# Patient Record
Sex: Female | Born: 1985 | Race: White | Hispanic: No | Marital: Married
Health system: Midwestern US, Academic
[De-identification: ages and names within clinical notes are randomized; demographics above are authoritative.]

## PROBLEM LIST (undated history)

## (undated) ENCOUNTER — Inpatient Hospital Stay (HOSPITAL_COMMUNITY): Payer: Self-pay

## (undated) DIAGNOSIS — I340 Nonrheumatic mitral (valve) insufficiency: Secondary | ICD-10-CM

## (undated) DIAGNOSIS — R1011 Right upper quadrant pain: Secondary | ICD-10-CM

## (undated) DIAGNOSIS — G971 Other reaction to spinal and lumbar puncture: Secondary | ICD-10-CM

## (undated) DIAGNOSIS — D649 Anemia, unspecified: Secondary | ICD-10-CM

## (undated) HISTORY — DX: Other reaction to spinal and lumbar puncture: G97.1

## (undated) HISTORY — PX: APPENDECTOMY: SHX54

## (undated) LAB — PREPARE FRESH FROZEN PLASMA
Blood Expiration Date: 202109040320
Blood Expiration Date: 202109040322
Dispense Status: TRANSFUSED
Dispense Status: TRANSFUSED

## (undated) LAB — PREPARE PLATELETS, LEUKOREDUCED
Blood Expiration Date: 202108312359
Dispense Status: TRANSFUSED

## (undated) LAB — PREPARE RBC, LEUKOREDUCED
Blood Expiration Date: 202109092359
Blood Expiration Date: 202109132359
Blood Expiration Date: 202109152359
Blood Expiration Date: 202109212359
Blood Expiration Date: 202109232359
Blood Expiration Date: 202109232359
Dispense Status: TRANSFUSED
Dispense Status: TRANSFUSED
Dispense Status: TRANSFUSED
Dispense Status: TRANSFUSED
Dispense Status: TRANSFUSED
Dispense Status: TRANSFUSED

---

## 2014-02-19 NOTE — Unmapped (Signed)
Patient is a 28 year old female referred to Dr. Fayette Pho by Dr Modena Jansky for evaluation of a left breast abcess. Patient was started on Keflex on 02/18/14. She is scheduled to see Dr. Fayette Pho on Wed, 02/24/14 for evaluation.

## 2014-02-24 ENCOUNTER — Ambulatory Visit: Admit: 2014-02-24 | Discharge: 2014-02-24 | Payer: PRIVATE HEALTH INSURANCE | Attending: Surgical Oncology

## 2014-02-24 DIAGNOSIS — S21009A Unspecified open wound of unspecified breast, initial encounter: Secondary | ICD-10-CM

## 2014-02-24 NOTE — Unmapped (Signed)
Pre-operative diagnosis:  Left breast open wound with hypertrophic granulation tissue  Post-operative diagnosis:  Left breast open wound with hypertrophic granulation tissue  Procedure:  Silver nitrate to hypertrophic granulation tissue  Surgeon:  Silva Bandy, MD  Anesthetic:  3 mL 1.0% lidocaine with epinephrine    After obtaining informed consent, the patient was placed in the recumbent position.  A timeout was called, and the procedure planned, site and side confirmed before we proceeded.  The left breast was prepped and draped in the usual sterile fashion.  Local anesthetic in the form of 3 mL of 1% lidocaine was used to infiltrate the dermis and subcutaneous tissue deep to the surface of the granulation tissue.  A stick of silver nitrate was rolled over the hypertrophic granulation until the tissue was flush with the skin.  This was then dressed with antibiotic ointment and a bandaid.  The patient tolerated the procedure well.  Tanya Dunn A. Fayette Pho, MD, PhD

## 2014-02-24 NOTE — Unmapped (Signed)
2 week follow up     Tonye Royalty  Administrative Assistant  Silva Bandy, MD  Main Office Ph# [phone]  Line to leave voice message: [phone]  Fax# [phone]    Ollen Gross, RN  Office phone# [phone]  After Hours phone #[phone]    Hours of Operation  Monday through Friday  8 am to 5 pm

## 2014-02-24 NOTE — Unmapped (Signed)
History of Present Illness:   Tanya Dunn is a 28 y.o. female    HPI   Patient is a 28 year old female, recently postpartum, who developed a left breast mastitis which evolved into an abscess.  Her physician,  Dr. Modena Jansky, placed her on Keflex.  The abscess decompressed on its own, and the wound has nearly healed but has hypertrophic granulation tissue extending out of the wound.  The patient reports reports no further erythema, or pain at the site.  She denies having any palpable mass or discharge.      Breast History:   Race: Caucasian  Ethnicity: non Ashkenazi [place], [place] and Amish ancestry  Age of Menarche: 81  LMP: May 2014  Gravida Para Aborta G1P1A0  Age at first childbirth: 75; Pt is 8 weeks postpartum  History of breast feeding: yes for 5 weeks  History of hormonal contraceptives: no  Age of Menopause:  n/a  History of HRT: no  Assisted Reproduction Treatments: yes, bromocriphine  Hereditary risk factors (pre-breast biopsy): yes  Prior history of breast biopsy: no  Family history of breast or ovarian cancer: Paternal aunt was diagnosed with ovarian cancer in her 66's.  Family history of other cancers: Maternal aunt was diagnosed with melanoma in her 43's.  Patient interested in future childbearing: yes   Patient interested in meeting with a fertility specialist: already has a fertility specialist    Histories:     She has no past medical history on file.    She has past surgical history that includes Appendectomy.    Her family history includes Cancer in her maternal aunt and paternal aunt; Heart disease in her maternal grandfather and paternal grandfather; Melanoma in her paternal aunt; Ovarian cancer in her maternal aunt; Stroke in her maternal grandfather and paternal grandfather.    She reports that she has never smoked. She has never used smokeless tobacco. She reports that she drinks alcohol. She reports that she does not use illicit drugs.      Review of Systems   Constitutional:  Positive for fatigue. Negative for fever, activity change and appetite change.   HENT: Negative for congestion, hearing loss, sinus pressure and sore throat.    Eyes: Negative for pain and visual disturbance.   Respiratory: Negative for cough and shortness of breath.    Cardiovascular: Negative for chest pain and palpitations.   Gastrointestinal: Negative for nausea, abdominal pain, diarrhea and constipation.   Genitourinary: Negative for dysuria, urgency, frequency and difficulty urinating.   Musculoskeletal: Positive for myalgias (bilateral breast tenderness). Negative for arthralgias.   Skin: Positive for wound (left breast has a healing area where the abscess drained on it's own). Negative for color change.   Neurological: Negative for dizziness, seizures, weakness and headaches.   Hematological: Negative for adenopathy. Does not bruise/bleed easily.   Psychiatric/Behavioral: Negative for dysphoric mood. The patient is not nervous/anxious.        Allergies:   Prochlorperazine; Promethazine; and Sulfa (sulfonamide antibiotics)    Medications:     Outpatient Encounter Prescriptions as of 02/24/2014   Medication Sig Dispense Refill   ??? dicloxacillin (DYNAPEN) 500 MG capsule        ??? ibuprofen (ADVIL,MOTRIN) 600 MG tablet Take by mouth.       ??? prenatal vit-iron fumarate-FA 27-1 mg Tab Take by mouth.         No facility-administered encounter medications on file as of 02/24/2014.        Objective:  Blood pressure 105/71, pulse 82, temperature 98.2 ??F (36.8 ??C), temperature source Oral, resp. rate 16, height 5' 3 (1.6 m), weight 152 lb (68.947 kg), SpO2 98.00%.    Physical Exam   Vitals reviewed.  Constitutional: She is oriented to person, place, and time. She appears well-developed and well-nourished. No distress.   HENT:   Head: Normocephalic and atraumatic.   Nose: Nose normal.   Mouth/Throat: No oropharyngeal exudate.   Eyes: Conjunctivae and EOM are normal. Pupils are equal, round, and reactive to light. No  scleral icterus.   Neck: Normal range of motion. Neck supple. No thyromegaly present.   Cardiovascular: Normal rate, regular rhythm and normal heart sounds.  Exam reveals no gallop and no friction rub.    No murmur heard.  Pulmonary/Chest: Effort normal and breath sounds normal. She has no wheezes. Right breast exhibits no inverted nipple, no mass, no nipple discharge, no skin change and no tenderness. Left breast exhibits skin change. Left breast exhibits no inverted nipple, no mass, no nipple discharge and no tenderness. Breasts are symmetrical.       Abdominal: Soft. She exhibits no distension and no mass. There is no tenderness.   Musculoskeletal: Normal range of motion. She exhibits no edema and no tenderness.   Lymphadenopathy:     She has no cervical adenopathy.        Right cervical: No superficial cervical and no deep cervical adenopathy present.       Left cervical: No superficial cervical and no deep cervical adenopathy present.     She has no axillary adenopathy.        Right axillary: No pectoral and no lateral adenopathy present.        Left axillary: No pectoral and no lateral adenopathy present.       Right: No supraclavicular adenopathy present.        Left: No supraclavicular adenopathy present.   Neurological: She is alert and oriented to person, place, and time. She exhibits normal muscle tone.   Skin: Skin is warm and dry. No erythema.   Psychiatric: She has a normal mood and affect. Her behavior is normal. Judgment and thought content normal.       Review of Lab Results:      No results found for this basename: WBC, HGB, HCT, PLT, GLUCOSE,  BUN, CREATININE, NA, K, CL, CO2, CASERUM, MAGNES,  ALBUMINSERUM, PREALBUMIN, GFRNONAFRAM,  EGFR, LDH, BILITOT, BILIDIR, PROT, AST, ALT, ALKPHOS, CA199, CEA, CA125, AFPTM       Imaging:       Pathology:       Cancer Staging:   No matching staging information was found for the patient.       Assessment:   1.  Patient with hypertrophic granulation tissue above  the level of skin.    Plan:   1.  This phenomenon can be managed by cautery.  Here, after obtaining consent from the patient, the area was prepped and local anesthetic used to infiltration along the skin and under the granulation tissue.  A Silver nitrate stick was rolled over the surface, until such time as the tissue was now skin level.  This was dressed with antibiotic ointment, gauze and tape.  The patient tolerated the procedure well.  Please see procedure note.  2.  Patient to return in two weeks to check the wound.

## 2014-02-25 NOTE — Unmapped (Signed)
Called patient, left message stating that we are following up with her to discuss her consultation visit with Dr. Silva Bandy on Feb 24, 2014.  Informed the patient that we simply want to make sure that she has no additional questions/concerns that we may need to further assist with and checking if everything met her expectations from checking in & registering, ease of finding office location & parking.  Also, requested patient call if she could recommend any area of improvement.  Reminded her that our staff are available should she think of anything else that we can assist her with.  Provided office contact information.

## 2014-03-10 ENCOUNTER — Ambulatory Visit: Admit: 2014-03-10 | Discharge: 2014-03-10 | Payer: PRIVATE HEALTH INSURANCE | Attending: Surgical Oncology

## 2014-03-10 DIAGNOSIS — N6011 Diffuse cystic mastopathy of right breast: Secondary | ICD-10-CM

## 2014-03-10 DIAGNOSIS — N6012 Diffuse cystic mastopathy of left breast: Secondary | ICD-10-CM

## 2014-03-10 NOTE — Unmapped (Signed)
History of Present Illness:   Tanya Dunn is a 28 y.o. female    HPI   Patient is a 28 year old female, recently postpartum, who developed a left breast mastitis which evolved into an abscess. Her physician, Dr. Modena Jansky, placed her on Keflex. The abscess decompressed on its own, and the wound has nearly healed but has hypertrophic granulation tissue extending out of the wound. At the last visit, the hypertrophic granulation tissue was treated with silver nitrate.      The patient returns today for follow-up.  The wound area has epithelialized and now healed.      Breast History:   Race: Caucasian   Ethnicity: non Ashkenazi [place], [place] and [place] ancestry   Age of Menarche: 47   LMP: May 2014   Gravida Para Aborta G1P1A0   Age at first childbirth: 89; Pt is 8 weeks postpartum   History of breast feeding: yes for 5 weeks   History of hormonal contraceptives: no   Age of Menopause: n/a   History of HRT: no   Assisted Reproduction Treatments: yes, bromocriphine   Hereditary risk factors (pre-breast biopsy): yes   Prior history of breast biopsy: no   Family history of breast or ovarian cancer: Paternal aunt was diagnosed with ovarian cancer in her 56's.   Family history of other cancers: Maternal aunt was diagnosed with melanoma in her 16's.   Patient interested in future childbearing: yes   Patient interested in meeting with a fertility specialist: already has a fertility specialist        Histories:     She has no past medical history on file.    She has past surgical history that includes Appendectomy.    Her family history includes Cancer in her maternal aunt and paternal aunt; Heart disease in her maternal grandfather and paternal grandfather; Melanoma in her paternal aunt; Ovarian cancer in her maternal aunt; Stroke in her maternal grandfather and paternal grandfather.    She reports that she has never smoked. She has never used smokeless tobacco. She reports that she drinks alcohol. She reports that  she does not use illicit drugs.      Review of Systems   Constitutional: Negative for fever, activity change, appetite change and fatigue.   HENT: Negative for congestion, ear discharge, sinus pressure and sore throat.    Eyes: Negative for pain and visual disturbance.   Respiratory: Negative for cough and shortness of breath.    Cardiovascular: Negative for chest pain and palpitations.   Gastrointestinal: Negative for nausea, abdominal pain, diarrhea and constipation.   Genitourinary: Negative for urgency, frequency and difficulty urinating.   Musculoskeletal: Negative for arthralgias and myalgias.   Skin: Negative for color change and wound.   Neurological: Negative for dizziness, weakness and headaches.   Hematological: Negative for adenopathy. Does not bruise/bleed easily.   Psychiatric/Behavioral: Negative for dysphoric mood. The patient is not nervous/anxious.        Allergies:   Prochlorperazine; Promethazine; and Sulfa (sulfonamide antibiotics)    Medications:     Outpatient Encounter Prescriptions as of 03/10/2014   Medication Sig Dispense Refill   ??? dicloxacillin (DYNAPEN) 500 MG capsule        ??? ibuprofen (ADVIL,MOTRIN) 600 MG tablet Take by mouth.       ??? prenatal vit-iron fumarate-FA 27-1 mg Tab Take by mouth.         No facility-administered encounter medications on file as of 03/10/2014.  Objective:       Blood pressure 98/66, pulse 84, temperature 98.2 ??F (36.8 ??C), temperature source Oral, resp. rate 16, height 5' 3 (1.6 m), weight 151 lb (68.493 kg), SpO2 98.00%.    Physical Exam   Vitals reviewed.  Constitutional: She is oriented to person, place, and time. She appears well-developed and well-nourished. No distress.   HENT:   Head: Normocephalic and atraumatic.   Nose: Nose normal.   Mouth/Throat: No oropharyngeal exudate.   Eyes: Conjunctivae and EOM are normal. Pupils are equal, round, and reactive to light. No scleral icterus.   Neck: Normal range of motion. Neck supple. No thyromegaly  present.   Cardiovascular: Normal rate, regular rhythm and normal heart sounds.  Exam reveals no gallop and no friction rub.    No murmur heard.  Pulmonary/Chest: Effort normal and breath sounds normal. She has no wheezes. Right breast exhibits no inverted nipple, no mass, no nipple discharge, no skin change and no tenderness. Left breast exhibits no inverted nipple, no mass, no nipple discharge, no skin change and no tenderness. Breasts are symmetrical.       Abdominal: Soft. She exhibits no distension and no mass. There is no tenderness.   Musculoskeletal: Normal range of motion. She exhibits no edema and no tenderness.   Lymphadenopathy:     She has no cervical adenopathy.        Right cervical: No superficial cervical and no deep cervical adenopathy present.       Left cervical: No superficial cervical and no deep cervical adenopathy present.     She has no axillary adenopathy.        Right axillary: No pectoral and no lateral adenopathy present.        Left axillary: No pectoral and no lateral adenopathy present.       Right: No supraclavicular adenopathy present.        Left: No supraclavicular adenopathy present.   Neurological: She is alert and oriented to person, place, and time. She exhibits normal muscle tone.   Skin: Skin is warm and dry. No erythema.   Psychiatric: She has a normal mood and affect. Her behavior is normal. Judgment and thought content normal.       Review of Lab Results:      No results found for this basename: WBC, HGB, HCT, PLT, GLUCOSE,  BUN, CREATININE, NA, K, CL, CO2, CASERUM, MAGNES,  ALBUMINSERUM, PREALBUMIN, GFRNONAFRAM,  EGFR, LDH, BILITOT, BILIDIR, PROT, AST, ALT, ALKPHOS, CA199, CEA, CA125, AFPTM       Imaging:       Pathology:       Cancer Staging:   No matching staging information was found for the patient.       Assessment:   1.  Patient with a healed left breast wound, with resolved hypertropic granulation tissue.    Plan:   1.  Patient to return if any further problem  with the breast arises.

## 2014-03-10 NOTE — Unmapped (Signed)
Return to office as needed.  Please call with questions or concerns.      Tonye Royalty  Administrative Assistant  Silva Bandy, MD  Main Office Ph# [phone]  Line to leave voice message: [phone]  Fax# [phone]    Ollen Gross, RN  Office phone# [phone]  After Hours phone #[phone]    Hours of Operation  Monday through Friday  8 am to 5 pm

## 2016-05-10 ENCOUNTER — Other Ambulatory Visit (HOSPITAL_COMMUNITY)
Admission: RE | Admit: 2016-05-10 | Discharge: 2016-05-10 | Disposition: A | Discharge status: Home / Self Care | Payer: 59 | Source: Ambulatory Visit | Attending: Obstetrics and Gynecology | Admitting: Obstetrics and Gynecology

## 2016-05-10 ENCOUNTER — Other Ambulatory Visit: Payer: Self-pay | Admitting: Nurse Practitioner

## 2016-05-10 DIAGNOSIS — Z01419 Encounter for gynecological examination (general) (routine) without abnormal findings: Secondary | ICD-10-CM | POA: Diagnosis present

## 2016-05-15 LAB — CYTOLOGY - PAP

## 2016-05-17 ENCOUNTER — Other Ambulatory Visit: Payer: Self-pay | Admitting: Nurse Practitioner

## 2016-05-17 DIAGNOSIS — E229 Hyperfunction of pituitary gland, unspecified: Principal | ICD-10-CM

## 2016-05-17 DIAGNOSIS — R7989 Other specified abnormal findings of blood chemistry: Secondary | ICD-10-CM

## 2016-05-17 DIAGNOSIS — N643 Galactorrhea not associated with childbirth: Secondary | ICD-10-CM

## 2016-05-31 ENCOUNTER — Other Ambulatory Visit: Payer: 59

## 2016-06-12 ENCOUNTER — Ambulatory Visit
Admission: RE | Admit: 2016-06-12 | Discharge: 2016-06-12 | Disposition: A | Discharge status: Home / Self Care | Payer: 59 | Source: Ambulatory Visit | Attending: Nurse Practitioner | Admitting: Nurse Practitioner

## 2016-06-12 DIAGNOSIS — N643 Galactorrhea not associated with childbirth: Secondary | ICD-10-CM

## 2016-06-12 DIAGNOSIS — E229 Hyperfunction of pituitary gland, unspecified: Principal | ICD-10-CM

## 2016-06-12 DIAGNOSIS — R7989 Other specified abnormal findings of blood chemistry: Secondary | ICD-10-CM

## 2016-06-12 MED ORDER — GADOBENATE DIMEGLUMINE 529 MG/ML IV SOLN
10.0000 mL | Freq: Once | INTRAVENOUS | Status: AC | PRN
  Administered 2016-06-12: 10 mL via INTRAVENOUS

## 2016-07-11 ENCOUNTER — Other Ambulatory Visit: Payer: Self-pay | Admitting: Internal Medicine

## 2016-07-11 DIAGNOSIS — I340 Nonrheumatic mitral (valve) insufficiency: Secondary | ICD-10-CM

## 2016-07-23 ENCOUNTER — Other Ambulatory Visit (HOSPITAL_COMMUNITY): Payer: 59

## 2016-07-30 ENCOUNTER — Other Ambulatory Visit (HOSPITAL_COMMUNITY): Payer: 59

## 2016-08-03 ENCOUNTER — Telehealth: Payer: Self-pay | Admitting: *Deleted

## 2016-08-03 NOTE — Telephone Encounter (Signed)
A message was left Samantha Mahoney to call us to reschedule her echo. Patient cancel on 10/23 and no-showed on 07/30/16.

## 2016-10-01 NOTE — L&D Delivery Note (Signed)
Delivery Note At 4:08 PM a viable female was delivered via  (Presentation: ROA).  APGAR: 8, 9; weight pending .  Loose nuchal cord around leg. Placenta status: to L&D Cord: normal  with the following complications: none.    Anesthesia:  epidural Episiotomy:  none Lacerations:  2nd degree perineal Suture Repair: 2.0 3.0 vicryl Est. Blood Loss (mL):  300cc  Mom to postpartum.  Baby to Couplet care / Skin to Skin.  [name], M 07/13/2017, 4:32 PM

## 2016-11-29 LAB — OB RESULTS CONSOLE RUBELLA ANTIBODY, IGM: Rubella: IMMUNE

## 2016-11-29 LAB — OB RESULTS CONSOLE ABO/RH: RH Type: POSITIVE

## 2016-11-29 LAB — OB RESULTS CONSOLE HIV ANTIBODY (ROUTINE TESTING): HIV: NONREACTIVE

## 2016-11-29 LAB — OB RESULTS CONSOLE ANTIBODY SCREEN: ANTIBODY SCREEN: NEGATIVE

## 2016-11-29 LAB — OB RESULTS CONSOLE HEPATITIS B SURFACE ANTIGEN: HEP B S AG: NEGATIVE

## 2016-11-29 LAB — OB RESULTS CONSOLE RPR: RPR: NONREACTIVE

## 2016-12-19 LAB — OB RESULTS CONSOLE GC/CHLAMYDIA
CHLAMYDIA, DNA PROBE: NEGATIVE
GC PROBE AMP, GENITAL: NEGATIVE

## 2017-02-06 ENCOUNTER — Other Ambulatory Visit: Payer: Self-pay | Admitting: Internal Medicine

## 2017-02-06 ENCOUNTER — Other Ambulatory Visit: Payer: Self-pay

## 2017-02-06 ENCOUNTER — Ambulatory Visit (HOSPITAL_COMMUNITY): Payer: 59 | Attending: Cardiovascular Disease

## 2017-02-06 DIAGNOSIS — I340 Nonrheumatic mitral (valve) insufficiency: Secondary | ICD-10-CM

## 2017-02-06 DIAGNOSIS — I341 Nonrheumatic mitral (valve) prolapse: Secondary | ICD-10-CM | POA: Diagnosis present

## 2017-02-06 DIAGNOSIS — I081 Rheumatic disorders of both mitral and tricuspid valves: Secondary | ICD-10-CM | POA: Diagnosis not present

## 2017-02-21 ENCOUNTER — Ambulatory Visit: Payer: 59 | Admitting: Interventional Cardiology

## 2017-04-12 ENCOUNTER — Inpatient Hospital Stay (HOSPITAL_COMMUNITY)
Admission: AD | Admit: 2017-04-12 | Discharge: 2017-04-12 | Disposition: A | Discharge status: Home / Self Care | Payer: 59 | Source: Ambulatory Visit | Attending: Obstetrics & Gynecology | Admitting: Obstetrics & Gynecology

## 2017-04-12 ENCOUNTER — Encounter (HOSPITAL_COMMUNITY): Payer: Self-pay

## 2017-04-12 DIAGNOSIS — O26892 Other specified pregnancy related conditions, second trimester: Secondary | ICD-10-CM | POA: Diagnosis not present

## 2017-04-12 DIAGNOSIS — Z3A26 26 weeks gestation of pregnancy: Secondary | ICD-10-CM | POA: Insufficient documentation

## 2017-04-12 DIAGNOSIS — R1011 Right upper quadrant pain: Secondary | ICD-10-CM | POA: Diagnosis present

## 2017-04-12 DIAGNOSIS — O26899 Other specified pregnancy related conditions, unspecified trimester: Secondary | ICD-10-CM

## 2017-04-12 HISTORY — DX: Anemia, unspecified: D64.9

## 2017-04-12 HISTORY — DX: Right upper quadrant pain: R10.11

## 2017-04-12 HISTORY — DX: Nonrheumatic mitral (valve) insufficiency: I34.0

## 2017-04-12 LAB — COMPREHENSIVE METABOLIC PANEL
ALBUMIN: 3.1 g/dL — AB (ref 3.5–5.0)
ALT: 11 U/L — AB (ref 14–54)
AST: 16 U/L (ref 15–41)
Alkaline Phosphatase: 69 U/L (ref 38–126)
Anion gap: 6 (ref 5–15)
BUN: 8 mg/dL (ref 6–20)
CHLORIDE: 104 mmol/L (ref 101–111)
CO2: 24 mmol/L (ref 22–32)
CREATININE: 0.51 mg/dL (ref 0.44–1.00)
Calcium: 8.8 mg/dL — ABNORMAL LOW (ref 8.9–10.3)
GFR calc Af Amer: >60 mL/min (ref >60)
GFR calc non Af Amer: >60 mL/min (ref >60)
GLUCOSE: 91 mg/dL (ref 65–99)
POTASSIUM: 3.6 mmol/L (ref 3.5–5.1)
SODIUM: 134 mmol/L — AB (ref 135–145)
Total Bilirubin: 0.4 mg/dL (ref 0.3–1.2)
Total Protein: 6.3 g/dL — ABNORMAL LOW (ref 6.5–8.1)

## 2017-04-12 LAB — CBC WITH DIFFERENTIAL/PLATELET
BASOS PCT: 0 %
Basophils Absolute: 0 10*3/uL (ref 0.0–0.1)
EOS ABS: 0 10*3/uL (ref 0.0–0.7)
Eosinophils Relative: 0 %
HEMATOCRIT: 30.4 % — AB (ref 36.0–46.0)
HEMOGLOBIN: 10.1 g/dL — AB (ref 12.0–15.0)
Lymphocytes Relative: 14 %
Lymphs Abs: 1.1 10*3/uL (ref 0.7–4.0)
MCH: 28.7 pg (ref 26.0–34.0)
MCHC: 33.2 g/dL (ref 30.0–36.0)
MCV: 86.4 fL (ref 78.0–100.0)
MONOS PCT: 4 %
Monocytes Absolute: 0.3 10*3/uL (ref 0.1–1.0)
NEUTROS ABS: 6.2 10*3/uL (ref 1.7–7.7)
NEUTROS PCT: 82 %
Platelets: 191 10*3/uL (ref 150–400)
RBC: 3.52 MIL/uL — AB (ref 3.87–5.11)
RDW: 13.5 % (ref 11.5–15.5)
WBC: 7.6 10*3/uL (ref 4.0–10.5)

## 2017-04-12 LAB — URINALYSIS, ROUTINE W REFLEX MICROSCOPIC
BILIRUBIN URINE: NEGATIVE
GLUCOSE, UA: NEGATIVE mg/dL
HGB URINE DIPSTICK: NEGATIVE
KETONES UR: NEGATIVE mg/dL
NITRITE: NEGATIVE
PROTEIN: NEGATIVE mg/dL
Specific Gravity, Urine: 1.02 (ref 1.005–1.030)
pH: 6 (ref 5.0–8.0)

## 2017-04-12 LAB — AMYLASE: AMYLASE: 99 U/L (ref 28–100)

## 2017-04-12 LAB — LIPASE, BLOOD: LIPASE: 45 U/L (ref 11–51)

## 2017-04-12 NOTE — MAU Provider Note (Signed)
History     CSN: [id number]  Arrival date and time: 04/12/17 1338   First Provider Initiated Contact with Patient 04/12/17 1441      Chief Complaint  Patient presents with  . Abdominal Pain    upper right side   HPI  Samantha Mahoney is a 31 yo G2P1001 at 26.[redacted] wks gestation presenting with complaints of RUQ pain since Monday 7/9. She describes the pain as being burning and stabbing at times.She reports the pain is worsening daily and she thinks it worsens with eating.  She called the OB office today and they told her to come here to have an ultrasound to check for gallstones or gallbladder problem.  She last ate lunch at 12 pm today. She denies contractions, VB, LOF or abnormal d/c.  She has had (+) FM all day.  Past Medical History:  Diagnosis Date  . Anemia   . Mitral valve regurgitation     Past Surgical History:  Procedure Laterality Date  . APPENDECTOMY      History reviewed. No pertinent family history.  Social History  Substance Use Topics  . Smoking status: Never Smoker  . Smokeless tobacco: Never Used  . Alcohol use No    Allergies:  Allergies  Allergen Reactions  . Compazine [Prochlorperazine Edisylate] Anxiety  . Phenergan [Promethazine] Anxiety  . Sulfa Antibiotics Rash    No prescriptions prior to admission.    Review of Systems  Constitutional: Negative.  Appetite change: no change in appetite since start of pain on Monday.  HENT: Negative.   Eyes: Negative.   Respiratory: Negative.   Cardiovascular: Negative.   Gastrointestinal: Positive for abdominal pain (worsening with eating, increasing daily).  Endocrine: Negative.   Genitourinary: Negative.   Musculoskeletal: Negative.   Skin: Negative.   Allergic/Immunologic: Negative.   Neurological: Negative.   Psychiatric/Behavioral: Negative.    Physical Exam   Blood pressure 106/74, pulse (!) 106, temperature 98.3 F (36.8 C), temperature source Oral, resp. rate 18, height 5\' 3"  (1.6 m),  weight 73.5 kg (162 lb).  Physical Exam  Constitutional: She is oriented to person, place, and time. She appears well-developed and well-nourished.  HENT:  Head: Normocephalic.  Eyes: Pupils are equal, round, and reactive to light.  Neck: Normal range of motion.  Cardiovascular: Normal rate, regular rhythm and normal heart sounds.   Respiratory: Effort normal and breath sounds normal.  GI: Soft. Bowel sounds are normal.  Genitourinary: Vagina normal and uterus normal.  Genitourinary Comments: Uterus: gravid, non-tender, S=D, cx; closed/long/soft/posterior  Musculoskeletal: Normal range of motion.  Neurological: She is alert and oriented to person, place, and time. She has normal reflexes.  Skin: Skin is warm and dry.  Psychiatric: She has a normal mood and affect. Her behavior is normal. Judgment and thought content normal.    MAU Course  Procedures  MDM CCUA CBC with Diff CMP Amylase Lipase NST - FHR: 145 bpm / moderate variability / accels present / decels absent TOCO: none *Consult with Dr. Dion Body - notified of patient's complaints, assessment lab results - ok to have pt return tomorrow for gallbladder U/S, may take Tylenol 100 mg   Results for orders placed or performed during the hospital encounter of 04/12/17 (from the past 24 hour(s))  CBC with Differential/Platelet     Status: Abnormal   Collection Time: 04/12/17  3:08 PM  Result Value Ref Range   WBC 7.6 4.0 - 10.5 K/uL   RBC 3.52 (L) 3.87 - 5.11 MIL/uL  Hemoglobin 10.1 (L) 12.0 - 15.0 g/dL   HCT 47.830.4 (L) 29.536.0 - 62.146.0 %   MCV 86.4 78.0 - 100.0 fL   MCH 28.7 26.0 - 34.0 pg   MCHC 33.2 30.0 - 36.0 g/dL   RDW 30.813.5 65.711.5 - 84.615.5 %   Platelets 191 150 - 400 K/uL   Neutrophils Relative % 82 %   Neutro Abs 6.2 1.7 - 7.7 K/uL   Lymphocytes Relative 14 %   Lymphs Abs 1.1 0.7 - 4.0 K/uL   Monocytes Relative 4 %   Monocytes Absolute 0.3 0.1 - 1.0 K/uL   Eosinophils Relative 0 %   Eosinophils Absolute 0.0 0.0 - 0.7  K/uL   Basophils Relative 0 %   Basophils Absolute 0.0 0.0 - 0.1 K/uL  Amylase     Status: None   Collection Time: 04/12/17  3:08 PM  Result Value Ref Range   Amylase 99 28 - 100 U/L  Lipase, blood     Status: None   Collection Time: 04/12/17  3:08 PM  Result Value Ref Range   Lipase 45 11 - 51 U/L  Comprehensive metabolic panel     Status: Abnormal   Collection Time: 04/12/17  3:08 PM  Result Value Ref Range   Sodium 134 (L) 135 - 145 mmol/L   Potassium 3.6 3.5 - 5.1 mmol/L   Chloride 104 101 - 111 mmol/L   CO2 24 22 - 32 mmol/L   Glucose, Bld 91 65 - 99 mg/dL   BUN 8 6 - 20 mg/dL   Creatinine, Ser 9.620.51 0.44 - 1.00 mg/dL   Calcium 8.8 (L) 8.9 - 10.3 mg/dL   Total Protein 6.3 (L) 6.5 - 8.1 g/dL   Albumin 3.1 (L) 3.5 - 5.0 g/dL   AST 16 15 - 41 U/L   ALT 11 (L) 14 - 54 U/L   Alkaline Phosphatase 69 38 - 126 U/L   Total Bilirubin 0.4 0.3 - 1.2 mg/dL   GFR calc non Af Amer >60 >60 mL/min   GFR calc Af Amer >60 >60 mL/min   Anion gap 6 5 - 15   Assessment and Plan  Pregnancy with abdominal pain of right upper quadrant, antepartum - Return to MAU tomorrow for gallbladder U/S after being NPO x 6 hrs - May take Tylenol 1000 mg every 6 hrs prn pain - [facility]  Discharge home Patient verbalized an understanding of the plan of care and agrees.    Raelyn Moraolitta Shyne Lehrke, MSN, CNM 04/12/2017, 2:45 PM

## 2017-04-12 NOTE — MAU Note (Signed)
Onset of upper right side pain since Monday has gotten worse, worsens with eating.

## 2017-04-15 ENCOUNTER — Other Ambulatory Visit: Payer: Self-pay | Admitting: Obstetrics and Gynecology

## 2017-04-15 DIAGNOSIS — R1011 Right upper quadrant pain: Secondary | ICD-10-CM

## 2017-04-19 ENCOUNTER — Ambulatory Visit
Admission: RE | Admit: 2017-04-19 | Discharge: 2017-04-19 | Disposition: A | Discharge status: Home / Self Care | Payer: 59 | Source: Ambulatory Visit | Attending: Obstetrics and Gynecology | Admitting: Obstetrics and Gynecology

## 2017-04-19 DIAGNOSIS — R1011 Right upper quadrant pain: Secondary | ICD-10-CM

## 2017-06-19 ENCOUNTER — Encounter (HOSPITAL_COMMUNITY): Payer: Self-pay | Admitting: *Deleted

## 2017-06-19 ENCOUNTER — Telehealth (HOSPITAL_COMMUNITY): Payer: Self-pay | Admitting: *Deleted

## 2017-06-19 NOTE — Telephone Encounter (Signed)
Preadmission screen  

## 2017-06-23 MED ORDER — TERBUTALINE SULFATE 1 MG/ML IJ SOLN: 0.2500 mg | Freq: Once | INTRAMUSCULAR | Status: DC

## 2017-06-23 NOTE — H&P (Signed)
HPI: 31 y/o G2P81001 @ [redacted]w[redacted]d estimated gestational age (as dated by LMP c/w 20 week ultrasound) presents for scheduled external cephalic version.   no Leaking of Fluid,   no Vaginal Bleeding,   no Uterine Contractions,  + Fetal Movement.  Prenatal care has been provided by Dr. Charlotta Newton  ROS: no HA, no epigastric pain, no visual changes.    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, pt asymptomatic   Prenatal Transfer Tool  Maternal Diabetes: No Genetic Screening: Normal Maternal Ultrasounds/Referrals: Normal Fetal Ultrasounds or other Referrals:  None Maternal Substance Abuse:  No Significant Maternal Medications:  None Significant Maternal Lab Results: Lab values include: Group B Strep negative   PNL:  GBS neg, Rub Immune, Hep B neg, RPR NR, HIV neg, GC/C neg, glucola:elevated, 3hr within normal limits Hgb: 10.8 Blood type: B positive, antibody neg  Immunizations: Tdap: 05/21/17 Flu: 06/18/17  OBHx: 2015- FTNSVD, 7#8oz, uncomplicated PMHx:  Prolactinoma, mitral valve prolapse- normal ECHO Meds:  PNV,iron  Allergy:   Allergies  Allergen Reactions  . Compazine [Prochlorperazine Edisylate] Anxiety  . Phenergan [Promethazine] Anxiety  . Sulfa Antibiotics Rash   SurgHx: none SocHx:   no Tobacco, no  EtOH, no Illicit Drugs  O: Gen. AAOx3, NAD CV.  RRR Resp. CTAB, no wheeze or crackles. Abd. Gravid,  no tenderness,  no rigidity,  no guarding Extr.  1+ non-pitting edema, no calf tenderness bilaterally  FHT: 145 baseline, mod variability, + accels,  no decels Toco: none BSUS: vertex  Labs: see orders  A/P:  31 y.o. G2P1001 @ [redacted]w[redacted]d EGA who presents for scheduled external cephalic version due to breech presentation -FWB:  Cat. I -Fetal presentation is now vertex - plan to discharge home, f/u as scheduled for OB visit in office   [name], DO [phone] (pager) [phone] (office)

## 2017-06-24 ENCOUNTER — Observation Stay (HOSPITAL_COMMUNITY)
Admission: RE | Admit: 2017-06-24 | Discharge: 2017-06-24 | Disposition: A | Discharge status: Home / Self Care | Payer: 59 | Source: Ambulatory Visit | Attending: Obstetrics & Gynecology | Admitting: Obstetrics & Gynecology

## 2017-06-24 DIAGNOSIS — Z3A36 36 weeks gestation of pregnancy: Secondary | ICD-10-CM | POA: Diagnosis not present

## 2017-06-24 DIAGNOSIS — O321XX0 Maternal care for breech presentation, not applicable or unspecified: Principal | ICD-10-CM | POA: Insufficient documentation

## 2017-06-24 NOTE — Discharge Instructions (Signed)

## 2017-07-11 ENCOUNTER — Inpatient Hospital Stay (HOSPITAL_COMMUNITY): Payer: 59

## 2017-07-11 ENCOUNTER — Inpatient Hospital Stay (EMERGENCY_DEPARTMENT_HOSPITAL)
Admission: AD | Admit: 2017-07-11 | Discharge: 2017-07-11 | Disposition: A | Discharge status: Home / Self Care | Payer: 59 | Source: Ambulatory Visit | Attending: Obstetrics & Gynecology | Admitting: Obstetrics & Gynecology

## 2017-07-11 ENCOUNTER — Encounter (HOSPITAL_COMMUNITY): Payer: Self-pay | Admitting: *Deleted

## 2017-07-11 DIAGNOSIS — Z3689 Encounter for other specified antenatal screening: Secondary | ICD-10-CM

## 2017-07-11 DIAGNOSIS — Z3A39 39 weeks gestation of pregnancy: Secondary | ICD-10-CM

## 2017-07-11 DIAGNOSIS — O9942 Diseases of the circulatory system complicating childbirth: Secondary | ICD-10-CM | POA: Diagnosis not present

## 2017-07-11 DIAGNOSIS — O368330 Maternal care for abnormalities of the fetal heart rate or rhythm, third trimester, not applicable or unspecified: Secondary | ICD-10-CM | POA: Diagnosis not present

## 2017-07-11 DIAGNOSIS — O26893 Other specified pregnancy related conditions, third trimester: Secondary | ICD-10-CM | POA: Diagnosis not present

## 2017-07-11 DIAGNOSIS — O368390 Maternal care for abnormalities of the fetal heart rate or rhythm, unspecified trimester, not applicable or unspecified: Secondary | ICD-10-CM

## 2017-07-11 NOTE — MAU Provider Note (Signed)
History     CSN: [id number]  Arrival date and time: 07/11/17 1257  First Provider Initiated Contact with Patient 07/11/17 1340      Chief Complaint  Patient presents with  . Non-stress Test   HPI Samantha Mahoney is a 31 y.o. G2P1001 at 109w2d who presents from the office for fetal monitoring & BPP. Was in office today for routine OB visit. Fetal heart rate was high on doppler so NST performed. NST showed fetal tachycardia with deceleration.  Patient denies abdominal pain, vaginal bleeding, or LOF. Positive fetal movement.  OB History    Gravida Para Term Preterm AB Living   SAB TAB Ectopic Multiple Live Births           1      Past Medical History:  Diagnosis Date  . Anemia   . Mitral valve regurgitation   . Right upper quadrant pain 04/12/2017  . Spinal headache    had blood patch after meningitis tap as a child    Past Surgical History:  Procedure Laterality Date  . APPENDECTOMY      Family History  Problem Relation Age of Onset  . Multiple sclerosis Maternal Grandmother   . Diabetes Maternal Grandmother   . Diabetes Maternal Grandfather   . Heart disease Paternal Grandfather     Social History  Substance Use Topics  . Smoking status: Never Smoker  . Smokeless tobacco: Never Used  . Alcohol use No    Allergies:  Allergies  Allergen Reactions  . Compazine [Prochlorperazine Edisylate] Anxiety  . Phenergan [Promethazine] Anxiety  . Sulfa Antibiotics Rash    Prescriptions Prior to Admission  Medication Sig Dispense Refill Last Dose  . Prenatal Vit-Fe Fumarate-FA (PRENATAL MULTIVITAMIN) TABS tablet Take 3 tablets by mouth daily at 12 noon.   04/12/2017 at Unknown time    Review of Systems  Constitutional: Negative.   Gastrointestinal: Negative.   Genitourinary: Negative.    Physical Exam   Blood pressure 112/78, pulse 90, temperature 98.1 F (36.7 C), temperature source Oral, resp. rate 18, height  (1.575 m), weight 179 lb (81.2  kg).  Physical Exam  Nursing note and vitals reviewed. Constitutional: She is oriented to person, place, and time. She appears well-developed and well-nourished. No distress.  HENT:  Head: Normocephalic and atraumatic.  Eyes: Conjunctivae are normal. Right eye exhibits no discharge. Left eye exhibits no discharge. No scleral icterus.  Neck: Normal range of motion.  Respiratory: Effort normal. No respiratory distress.  Neurological: She is alert and oriented to person, place, and time.  Skin: Skin is warm and dry. She is not diaphoretic.  Psychiatric: She has a normal mood and affect. Her behavior is normal. Judgment and thought content normal.   Fetal Tracing:  Baseline: 130 Variability: moderate Accelerations: 15x15 Decelerations: none  Toco: irr ctx MAU Course  Procedures No results found for this or any previous visit (from the past 24 hour(s)).  MDM Reactive NST with normal baseline. BPP 8/8. Reviewed tracing with Dr. Charlotta Newton. Patient ok to discharge home with normal BPP. Patient will be scheduled for IOL this weekend.   Assessment and Plan  A: 1. NST (non-stress test) reactive   2. Fetal heart rate decelerations affecting management of mother   3. [redacted] weeks gestation of pregnancy    P: Discharge home Discussed reasons to return to MAU Informed that Dr. Charlotta Newton will call patient regarding IOL once it's scheduled  Judeth Horn  07/11/2017, 1:40 PM

## 2017-07-11 NOTE — MAU Note (Signed)
Sent from office for BPP. FHR elevated and then had a decel.Pt denies any pain or discomfort at this time. Good fetal movement repotted.

## 2017-07-11 NOTE — Discharge Instructions (Signed)

## 2017-07-12 ENCOUNTER — Telehealth (HOSPITAL_COMMUNITY): Payer: Self-pay | Admitting: *Deleted

## 2017-07-12 NOTE — Telephone Encounter (Signed)
Preadmission screen  

## 2017-07-12 NOTE — H&P (Signed)
31 y/o G2P1001 @ [redacted]w[redacted]d estimated gestational age (as dated by LMP c/w 20 week ultrasound) presents for elective IOL  no Leaking of Fluid,   no Vaginal Bleeding,   irregular Uterine Contractions,  + Fetal Movement.  Prenatal care has been provided by Dr. Charlotta Newton  ROS: no HA, no epigastric pain, no visual changes.    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, pt asymptomatic   Prenatal Transfer Tool  Maternal Diabetes: No Genetic Screening: Normal Maternal Ultrasounds/Referrals: Normal Fetal Ultrasounds or other Referrals:  None Maternal Substance Abuse:  No Significant Maternal Medications:  None Significant Maternal Lab Results: Lab values include: Group B Strep negative   PNL:  GBS neg, Rub Immune, Hep B neg, RPR NR, HIV neg, GC/C neg, glucola:elevated, 3hr within normal limits Hgb: 10.8 Blood type: B positive, antibody neg  Immunizations: Tdap: 05/21/17 Flu: 06/18/17  OBHx: 2015- FTNSVD, 7#8oz, uncomplicated PMHx:  Prolactinoma, mitral valve prolapse- normal ECHO Meds:  PNV,iron  Allergy:       Allergies  Allergen Reactions  . Compazine [Prochlorperazine Edisylate] Anxiety  . Phenergan [Promethazine] Anxiety  . Sulfa Antibiotics Rash   SurgHx: none SocHx:   no Tobacco, no  EtOH, no Illicit Drugs  O:  Gen. AAOx3, NAD CV.  RRR Resp. CTAB, no wheeze or crackles. Abd. Gravid,  no tenderness,  no rigidity,  no guarding Extr.  1+ non-pitting edema, no calf tenderness bilaterally  FHT: 130, reactive NST on 10/11, BPP 8/8  Labs: see orders  A/P:  31 y.o. G2P1001 @ [redacted]w[redacted]d for elective IOL -FWB: Reassuring as above -Labor: Plan for cytotec for ripening overnight -GBS neg -Pain management: IV or epidural upon request -CBC, T&S to be obtained   [name], DO [phone] (pager) [phone] (office)

## 2017-07-13 ENCOUNTER — Inpatient Hospital Stay (HOSPITAL_COMMUNITY): Payer: 59 | Admitting: Anesthesiology

## 2017-07-13 ENCOUNTER — Inpatient Hospital Stay (HOSPITAL_COMMUNITY)
Admission: RE | Admit: 2017-07-13 | Discharge: 2017-07-14 | DRG: 807 | Disposition: A | Discharge status: Home / Self Care | Payer: 59 | Source: Ambulatory Visit | Attending: Obstetrics & Gynecology | Admitting: Obstetrics & Gynecology

## 2017-07-13 ENCOUNTER — Encounter (HOSPITAL_COMMUNITY): Payer: Self-pay

## 2017-07-13 DIAGNOSIS — I341 Nonrheumatic mitral (valve) prolapse: Secondary | ICD-10-CM | POA: Diagnosis present

## 2017-07-13 DIAGNOSIS — O9942 Diseases of the circulatory system complicating childbirth: Principal | ICD-10-CM | POA: Diagnosis present

## 2017-07-13 DIAGNOSIS — Z3A39 39 weeks gestation of pregnancy: Secondary | ICD-10-CM

## 2017-07-13 DIAGNOSIS — O26893 Other specified pregnancy related conditions, third trimester: Secondary | ICD-10-CM | POA: Diagnosis present

## 2017-07-13 DIAGNOSIS — R1011 Right upper quadrant pain: Secondary | ICD-10-CM

## 2017-07-13 LAB — CBC
HCT: 31.5 % — ABNORMAL LOW (ref 36.0–46.0)
Hemoglobin: 9.8 g/dL — ABNORMAL LOW (ref 12.0–15.0)
MCH: 24.6 pg — ABNORMAL LOW (ref 26.0–34.0)
MCHC: 31.1 g/dL (ref 30.0–36.0)
MCV: 78.9 fL (ref 78.0–100.0)
PLATELETS: 205 10*3/uL (ref 150–400)
RBC: 3.99 MIL/uL (ref 3.87–5.11)
RDW: 16.6 % — AB (ref 11.5–15.5)
WBC: 7.1 10*3/uL (ref 4.0–10.5)

## 2017-07-13 LAB — TYPE AND SCREEN
ABO/RH(D): B POS
Antibody Screen: NEGATIVE

## 2017-07-13 LAB — OB RESULTS CONSOLE GBS: GBS: NEGATIVE

## 2017-07-13 LAB — RPR: RPR Ser Ql: NONREACTIVE

## 2017-07-13 LAB — ABO/RH: ABO/RH(D): B POS

## 2017-07-13 MED ORDER — FENTANYL 2.5 MCG/ML BUPIVACAINE 1/10 % EPIDURAL INFUSION (WH - ANES): 14.0000 mL/h | INTRAMUSCULAR | Status: DC | PRN

## 2017-07-13 MED ORDER — OXYCODONE-ACETAMINOPHEN 5-325 MG PO TABS: 1.0000 | ORAL_TABLET | ORAL | Status: DC | PRN

## 2017-07-13 MED ORDER — OXYCODONE-ACETAMINOPHEN 5-325 MG PO TABS: 2.0000 | ORAL_TABLET | ORAL | Status: DC | PRN

## 2017-07-13 MED ORDER — TERBUTALINE SULFATE 1 MG/ML IJ SOLN
0.2500 mg | Freq: Once | INTRAMUSCULAR | Status: DC | PRN
  Filled 2017-07-13: qty 1

## 2017-07-13 MED ORDER — FENTANYL 2.5 MCG/ML BUPIVACAINE 1/10 % EPIDURAL INFUSION (WH - ANES)
14.0000 mL/h | INTRAMUSCULAR | Status: DC | PRN
Start: 2017-07-13 — End: 2017-07-13
  Administered 2017-07-13: 14 mL/h via EPIDURAL
  Filled 2017-07-13: qty 100

## 2017-07-13 MED ORDER — EPHEDRINE 5 MG/ML INJ
10.0000 mg | INTRAVENOUS | Status: DC | PRN
  Filled 2017-07-13: qty 2

## 2017-07-13 MED ORDER — LACTATED RINGERS IV SOLN
500.0000 mL | Freq: Once | INTRAVENOUS | Status: AC
  Administered 2017-07-13: 500 mL via INTRAVENOUS

## 2017-07-13 MED ORDER — ZOLPIDEM TARTRATE 5 MG PO TABS: 5.0000 mg | ORAL_TABLET | Freq: Every evening | ORAL | Status: DC | PRN

## 2017-07-13 MED ORDER — ONDANSETRON HCL 4 MG PO TABS: 4.0000 mg | ORAL_TABLET | ORAL | Status: DC | PRN

## 2017-07-13 MED ORDER — LACTATED RINGERS IV SOLN
INTRAVENOUS | Status: DC
  Administered 2017-07-13: 01:00:00 via INTRAVENOUS

## 2017-07-13 MED ORDER — WITCH HAZEL-GLYCERIN EX PADS: 1.0000 "application " | MEDICATED_PAD | CUTANEOUS | Status: DC | PRN

## 2017-07-13 MED ORDER — DIPHENHYDRAMINE HCL 50 MG/ML IJ SOLN: 12.5000 mg | INTRAMUSCULAR | Status: DC | PRN

## 2017-07-13 MED ORDER — PRENATAL MULTIVITAMIN CH
1.0000 | ORAL_TABLET | Freq: Every day | ORAL | Status: DC
  Administered 2017-07-14: 1 via ORAL
  Filled 2017-07-13: qty 1

## 2017-07-13 MED ORDER — IBUPROFEN 600 MG PO TABS
600.0000 mg | ORAL_TABLET | Freq: Four times a day (QID) | ORAL | Status: DC
  Administered 2017-07-13 – 2017-07-14 (×5): 600 mg via ORAL
  Filled 2017-07-13 (×5): qty 1

## 2017-07-13 MED ORDER — ONDANSETRON HCL 4 MG/2ML IJ SOLN: 4.0000 mg | INTRAMUSCULAR | Status: DC | PRN

## 2017-07-13 MED ORDER — SIMETHICONE 80 MG PO CHEW: 80.0000 mg | CHEWABLE_TABLET | ORAL | Status: DC | PRN

## 2017-07-13 MED ORDER — COCONUT OIL OIL
1.0000 "application " | TOPICAL_OIL | Status: DC | PRN
  Administered 2017-07-14: 1 via TOPICAL
  Filled 2017-07-13: qty 120

## 2017-07-13 MED ORDER — OXYTOCIN 40 UNITS IN LACTATED RINGERS INFUSION - SIMPLE MED
1.0000 m[IU]/min | INTRAVENOUS | Status: DC
  Administered 2017-07-13: 2 m[IU]/min via INTRAVENOUS

## 2017-07-13 MED ORDER — ACETAMINOPHEN 325 MG PO TABS: 650.0000 mg | ORAL_TABLET | ORAL | Status: DC | PRN

## 2017-07-13 MED ORDER — ONDANSETRON HCL 4 MG/2ML IJ SOLN: 4.0000 mg | Freq: Four times a day (QID) | INTRAMUSCULAR | Status: DC | PRN

## 2017-07-13 MED ORDER — PHENYLEPHRINE 40 MCG/ML (10ML) SYRINGE FOR IV PUSH (FOR BLOOD PRESSURE SUPPORT)
80.0000 ug | PREFILLED_SYRINGE | INTRAVENOUS | Status: DC | PRN
  Filled 2017-07-13: qty 5

## 2017-07-13 MED ORDER — OXYTOCIN 40 UNITS IN LACTATED RINGERS INFUSION - SIMPLE MED
2.5000 [IU]/h | INTRAVENOUS | Status: DC
  Filled 2017-07-13: qty 1000

## 2017-07-13 MED ORDER — LIDOCAINE HCL (PF) 1 % IJ SOLN
INTRAMUSCULAR | Status: DC | PRN
  Administered 2017-07-13 (×2): 4 mL

## 2017-07-13 MED ORDER — TERBUTALINE SULFATE 1 MG/ML IJ SOLN: 0.2500 mg | Freq: Once | INTRAMUSCULAR | Status: DC | PRN

## 2017-07-13 MED ORDER — DIBUCAINE 1 % RE OINT: 1.0000 "application " | TOPICAL_OINTMENT | RECTAL | Status: DC | PRN

## 2017-07-13 MED ORDER — ACETAMINOPHEN 325 MG PO TABS
650.0000 mg | ORAL_TABLET | ORAL | Status: DC | PRN
Start: 2017-07-13 — End: 2017-07-15
  Administered 2017-07-14: 650 mg via ORAL
  Filled 2017-07-13: qty 2

## 2017-07-13 MED ORDER — DIPHENHYDRAMINE HCL 25 MG PO CAPS: 25.0000 mg | ORAL_CAPSULE | Freq: Four times a day (QID) | ORAL | Status: DC | PRN

## 2017-07-13 MED ORDER — LIDOCAINE HCL (PF) 1 % IJ SOLN
30.0000 mL | INTRAMUSCULAR | Status: DC | PRN
  Filled 2017-07-13: qty 30

## 2017-07-13 MED ORDER — LACTATED RINGERS IV SOLN: 500.0000 mL | INTRAVENOUS | Status: DC | PRN

## 2017-07-13 MED ORDER — SENNOSIDES-DOCUSATE SODIUM 8.6-50 MG PO TABS
2.0000 | ORAL_TABLET | ORAL | Status: DC
  Administered 2017-07-14: 2 via ORAL
  Filled 2017-07-13: qty 2

## 2017-07-13 MED ORDER — OXYTOCIN BOLUS FROM INFUSION
500.0000 mL | Freq: Once | INTRAVENOUS | Status: AC
  Administered 2017-07-13: 500 mL via INTRAVENOUS

## 2017-07-13 MED ORDER — BENZOCAINE-MENTHOL 20-0.5 % EX AERO
1.0000 "application " | INHALATION_SPRAY | CUTANEOUS | Status: DC | PRN
  Administered 2017-07-14: 1 via TOPICAL
  Filled 2017-07-13: qty 56

## 2017-07-13 MED ORDER — SOD CITRATE-CITRIC ACID 500-334 MG/5ML PO SOLN: 30.0000 mL | ORAL | Status: DC | PRN

## 2017-07-13 MED ORDER — MISOPROSTOL 25 MCG QUARTER TABLET
25.0000 ug | ORAL_TABLET | ORAL | Status: DC | PRN
  Administered 2017-07-13 (×2): 25 ug via VAGINAL
  Filled 2017-07-13 (×3): qty 1

## 2017-07-13 MED ORDER — PHENYLEPHRINE 40 MCG/ML (10ML) SYRINGE FOR IV PUSH (FOR BLOOD PRESSURE SUPPORT)
80.0000 ug | PREFILLED_SYRINGE | INTRAVENOUS | Status: DC | PRN
  Filled 2017-07-13: qty 5
  Filled 2017-07-13: qty 10

## 2017-07-13 NOTE — Anesthesia Pre-Procedure Evaluation (Signed)
Formatting of this note is different from the original.                                    Anesthesia Evaluation   Patient identified by MRN, date of birth, ID band  Patient awake    Reviewed:  Allergy & Precautions, Patient's Chart, lab work & pertinent test results    History of Anesthesia Complications  (+) POST - OP SPINAL HEADACHE and history of anesthetic complications    Airway  Mallampati: II    TM Distance: >3 FB  Neck ROM: Full     Dental  no notable dental hx.      Pulmonary  neg pulmonary ROS,     Pulmonary exam normal  breath sounds clear to auscultation     Cardiovascular  Normal cardiovascular exam+ Valvular Problems/Murmurs MR and MVP   Rhythm:Regular Rate:Normal    Echo 01/2017  Mild MR, otherwise unremarkable study    Neuro/Psych   Headaches, negative psych ROS    GI/Hepatic  negative GI ROS, Neg liver ROS,    Endo/Other   negative endocrine ROS   Renal/GU  negative Renal ROS       Musculoskeletal  negative musculoskeletal ROS  (+)    Abdominal    Peds   Hematology    (+) Blood dyscrasia, anemia ,    Anesthesia Other Findings     Reproductive/Obstetrics  (+) Pregnancy              Anesthesia Physical  Anesthesia Plan    ASA: II    Anesthesia Plan: Epidural     Post-op Pain Management:      Induction:     PONV Risk Score and Plan:     Airway Management Planned:     Additional Equipment:     Intra-op Plan:     Post-operative Plan:     Informed Consent: I have reviewed the patients History and Physical, chart, labs and discussed the procedure including the risks, benefits and alternatives for the proposed anesthesia with the patient or authorized representative who has indicated his/her understanding and acceptance.     Plan Discussed with:     Anesthesia Plan Comments:      Anesthesia Quick Evaluation    Electronically signed by Lewie Loron, MD at 07/13/2017 10:14 AM EDT

## 2017-07-13 NOTE — Progress Notes (Signed)
OB PN:  S: Reports feeling increased pressure/pain  O: BP 121/73   Pulse 84   Temp 98.9 F (37.2 C)   Resp 18   Ht  (1.575 m)   Wt 179 lb (81.2 kg)   SpO2 99%   BMI 32.74 kg/m   FHT: 145bpm, moderate variablity, + accels, early decels Toco: q2-47min SVE: 6/80/-1  A/P: 31 y.o. G2P1001 @ [redacted]w[redacted]d for elective IOL 1. FWB: Cat. I 2. Labor: continue Pit per protocol Pain: continue epidural GBS: negative  [name], DO [phone] (pager) [phone] (office)

## 2017-07-13 NOTE — Anesthesia Pain Management Evaluation Note (Signed)
  CRNA Pain Management Visit Note  Patient: Samantha Mahoney, 31 y.o., female  "Hello I am a member of the anesthesia team at [facility]. We have an anesthesia team available at all times to provide care throughout the hospital, including epidural management and anesthesia for C-section. I don't know your plan for the delivery whether it a natural birth, water birth, IV sedation, nitrous supplementation, doula or epidural, but we want to meet your pain goals."   1.Was your pain managed to your expectations on prior hospitalizations?   Yes   2.What is your expectation for pain management during this hospitalization?     Epidural  3.How can we help you reach that goal? epidural  Record the patient's initial score and the patient's pain goal.   Pain: 7  Pain Goal: 4 [facility] wants you to be able to say your pain was always managed very well.  Young Brim 07/13/2017

## 2017-07-13 NOTE — Progress Notes (Signed)
OB PN:  S: Resting comfortably with epidural  O: BP 96/77   Pulse 90   Temp 98 F (36.7 C) (Oral)   Resp 18   Ht  (1.575 m)   Wt 179 lb (81.2 kg)   SpO2 99%   BMI 32.74 kg/m   FHT: 145bpm, moderate variablity, + accels, no decels Toco: q2-55min SVE: 3.5/60/-2, AROM clear fluid  A/P: 31 y.o. G2P1001 @ [redacted]w[redacted]d for elective IOL 1. FWB: Cat. I 2. Labor: continue Pit per protocol Pain: continue epidural GBS: negative  [name], DO [phone] (pager) [phone] (office)

## 2017-07-13 NOTE — Anesthesia Preprocedure Evaluation (Signed)
Anesthesia Evaluation  Patient identified by MRN, date of birth, ID band Patient awake    Reviewed: Allergy & Precautions, Patient's Chart, lab work & pertinent test results  History of Anesthesia Complications (+) POST - OP SPINAL HEADACHE and history of anesthetic complications  Airway Mallampati: II  TM Distance: >3 FB Neck ROM: Full    Dental no notable dental hx.    Pulmonary neg pulmonary ROS,    Pulmonary exam normal breath sounds clear to auscultation       Cardiovascular Normal cardiovascular exam+ Valvular Problems/Murmurs MR and MVP  Rhythm:Regular Rate:Normal  Echo 01/2017 Mild MR, otherwise unremarkable study   Neuro/Psych  Headaches, negative psych ROS   GI/Hepatic negative GI ROS, Neg liver ROS,   Endo/Other  negative endocrine ROS  Renal/GU negative Renal ROS     Musculoskeletal negative musculoskeletal ROS (+)   Abdominal   Peds  Hematology  (+) Blood dyscrasia, anemia ,   Anesthesia Other Findings   Reproductive/Obstetrics (+) Pregnancy                             Anesthesia Physical Anesthesia Plan  ASA: II  Anesthesia Plan: Epidural   Post-op Pain Management:    Induction:   PONV Risk Score and Plan:   Airway Management Planned:   Additional Equipment:   Intra-op Plan:   Post-operative Plan:   Informed Consent: I have reviewed the patients History and Physical, chart, labs and discussed the procedure including the risks, benefits and alternatives for the proposed anesthesia with the patient or authorized representative who has indicated his/her understanding and acceptance.     Plan Discussed with:   Anesthesia Plan Comments:         Anesthesia Quick Evaluation

## 2017-07-13 NOTE — Lactation Note (Signed)
This note was copied from a baby's chart. Lactation Consultation Note  Patient Name: Samantha Mahoney GNFAO'Z Date: 07/13/2017 Reason for consult: Initial assessment   P2 Baby 5 hours old.  Sydney RN in nursery noted oral tongue restriction and difficulty latching. Sydney called and informed LC that she had started #24NS. Mother states she only breastfed her first child for a few weeks due to difficulty latching and really wants to bf this baby. Mother very excited about using the nipple shield. Noted baby has anterior lingual frenulum with a strong suck.  No biting noted. LC was able to assist mother with latching without the NS x 2. Reminded mother to support breast underneath.   Reviewed how to apply NS and latched baby with NS in case mother has difficulty through the night. Suggest always attempting without NS first.  Suggest to Jan RN that if mother continues to use NS she will need to be set up with DEBP and give volume back with spoon or syringe. Provided parents with frenulum resource sheets. Mom encouraged to feed baby 8-12 times/24 hours and with feeding cues.  Mom made aware of O/P services, breastfeeding support groups, community resources, and our phone # for post-discharge questions.    Maternal Data Has patient been taught Hand Expression?: Yes  Feeding Feeding Type: Breast Fed  LATCH Score Latch: Repeated attempts needed to sustain latch, nipple held in mouth throughout feeding, stimulation needed to elicit sucking reflex.  Audible Swallowing: A few with stimulation  Type of Nipple: Everted at rest and after stimulation (short shaft)  Comfort (Breast/Nipple): Soft / non-tender  Hold (Positioning): Assistance needed to correctly position infant at breast and maintain latch.  LATCH Score: 7  Interventions Interventions: Breast feeding basics reviewed;Skin to skin;Assisted with latch;Hand express;Pre-pump if needed;Breast compression;Support pillows;Position  options;Hand pump  Lactation Tools Discussed/Used Tools: Shells;Pump;Nipple Shields Nipple shield size: 24;20 Shell Type: Inverted Breast pump type: Manual   Consult Status Consult Status: Follow-up Date: 07/14/17 Follow-up type: In-patient    Dahlia Byes [facility] 07/13/2017, 9:28 PM

## 2017-07-13 NOTE — Progress Notes (Signed)
OB PN:  S: Pt resting comfortably, starting to feel more painful contractions  O: BP 110/83   Pulse 91   Temp 98 F (36.7 C) (Oral)   Resp 18   Ht  (1.575 m)   Wt 179 lb (81.2 kg)   BMI 32.74 kg/m   FHT: 140bpm, moderate variablity, + accels, no decels Toco: q2-29min SVE: deferred, per RN @ 0522, 2/50/-2  A/P: 31 y.o. G2P1001 @ [redacted]w[redacted]d for elective IOL 1. FWB: Cat. I 2. Labor: s/p cytotec x 2, if contractions space out or no further change, will start Pitocin Pain: IV or epidural upon request GBS: negative  [name], DO [phone] (pager) [phone] (office)

## 2017-07-14 LAB — CBC
HCT: 28.5 % — ABNORMAL LOW (ref 36.0–46.0)
HEMOGLOBIN: 8.9 g/dL — AB (ref 12.0–15.0)
MCH: 24.3 pg — AB (ref 26.0–34.0)
MCHC: 31.2 g/dL (ref 30.0–36.0)
MCV: 77.7 fL — AB (ref 78.0–100.0)
PLATELETS: 163 10*3/uL (ref 150–400)
RBC: 3.67 MIL/uL — AB (ref 3.87–5.11)
RDW: 16.5 % — AB (ref 11.5–15.5)
WBC: 9.5 10*3/uL (ref 4.0–10.5)

## 2017-07-14 MED ORDER — DOCUSATE SODIUM 100 MG PO CAPS: 100.0000 mg | ORAL_CAPSULE | Freq: Two times a day (BID) | ORAL | 0 refills | Status: AC

## 2017-07-14 MED ORDER — FERROUS SULFATE 325 (65 FE) MG PO TABS
325.0000 mg | ORAL_TABLET | Freq: Every day | ORAL | Status: DC
  Administered 2017-07-14: 325 mg via ORAL
  Filled 2017-07-14: qty 1

## 2017-07-14 MED ORDER — FERROUS SULFATE 325 (65 FE) MG PO TABS: 325.0000 mg | ORAL_TABLET | Freq: Every day | ORAL | 3 refills | Status: AC

## 2017-07-14 MED ORDER — IBUPROFEN 600 MG PO TABS: 600.0000 mg | ORAL_TABLET | Freq: Four times a day (QID) | ORAL | 0 refills | Status: AC

## 2017-07-14 NOTE — Anesthesia Post-Procedure Evaluation (Signed)
Formatting of this note is different from the original.   Anesthesia Post Note    Patient: Tanya Dunn    Procedure(s) Performed: AN AD HOC LABOR EPIDURAL        Patient location during evaluation: Mother Baby  Anesthesia Type: Epidural  Level of consciousness: awake and alert and oriented  Pain management: satisfactory to patient  Vital Signs Assessment: post-procedure vital signs reviewed and stable  Respiratory status: spontaneous breathing and nonlabored ventilation  Cardiovascular status: stable  Postop Assessment: no headache, no backache, no signs of nausea or vomiting, adequate PO intake and patient able to bend at knees (patient up walking)  Anesthetic complications: no    Last Vitals:   Vitals:    07/14/17 0014 07/14/17 0626   BP: 111/73 105/72   Pulse: 86 93   Resp: 16 16   Temp: 37.1 C 36.7 C   SpO2:       Last Pain:   Vitals:    07/14/17 0626   TempSrc: Oral   PainSc:      Pain Goal:                    [name]      Electronically signed by Shanon Payor, CRNA at 07/14/2017  8:26 AM EDT

## 2017-07-14 NOTE — Progress Notes (Signed)
Notified Dr Charlotta Newton that Dr Dareen Piano has cleared baby for discharge and placed orders. New orders for mom's discharge. Jtwells, rn

## 2017-07-14 NOTE — Discharge Summary (Signed)
OB Discharge Summary     Patient Name: Samantha Mahoney DOB: [id number] MRN: [id number]  Date of admission: 07/13/2017 Delivering MD: [name]   Date of discharge: 07/14/2017  Admitting diagnosis: 39WKS INDUCTION Intrauterine pregnancy: [redacted]w[redacted]d     Secondary diagnosis:  Active Problems:   Labor and delivery, indication for care  Additional problems: Prolactinoma     Discharge diagnosis: Term Pregnancy Delivered                                                                                                Post partum procedures:n/a  Augmentation: AROM and Pitocin  Complications: None  Hospital course:  Induction of Labor With Vaginal Delivery   31 y.o. yo G2P1001 at [redacted]w[redacted]d was admitted to the hospital 07/13/2017 for induction of labor.  Indication for induction: Favorable cervix at term.  Patient had an uncomplicated labor course as follows: Membrane Rupture Time/Date: 1:05 PM ,07/13/2017   Intrapartum Procedures: Episiotomy:                                           Lacerations:  2nd degree [3];Perineal [11]  Patient had delivery of a Viable infant.  Information for the patient's newborn:  Miley, Blanchett [[id number]]  Delivery Method: Vag-Spont   07/13/2017  Details of delivery can be found in separate delivery note.  Patient had a routine postpartum course. Patient is discharged home 07/14/17.  Physical exam  Vitals:   07/14/17 0014 07/14/17 0626 07/14/17 0908 07/14/17 1940  BP: 111/73 105/72 114/81 115/66  Pulse: 86 93 (!) 103 99  Resp: Temp: 98.7 F (37.1 C) 98.1 F (36.7 C) 98.6 F (37 C) 98.3 F (36.8 C)  TempSrc: Oral Oral Oral Oral  SpO2:      Weight:      Height:       General: alert, cooperative and no distress Lochia: appropriate Uterine Fundus: firm Incision: N/A DVT Evaluation: No evidence of DVT seen on physical exam. Labs: Lab Results  Component Value Date   WBC 9.5 07/14/2017   HGB 8.9 (L) 07/14/2017   HCT 28.5 (L)  07/14/2017   MCV 77.7 (L) 07/14/2017   PLT 163 07/14/2017   CMP Latest Ref Rng & Units 04/12/2017  Glucose 65 - 99 mg/dL 91  BUN 6 - 20 mg/dL 8  Creatinine 8.41 - 6.60 mg/dL 6.30  Sodium 160 - 109 mmol/L 134(L)  Potassium 3.5 - 5.1 mmol/L 3.6  Chloride 101 - 111 mmol/L 104  CO2 22 - 32 mmol/L 24  Calcium 8.9 - 10.3 mg/dL 3.2(T)  Total Protein 6.5 - 8.1 g/dL 6.3(L)  Total Bilirubin 0.3 - 1.2 mg/dL 0.4  Alkaline Phos 38 - 126 U/L 69  AST 15 - 41 U/L 16  ALT 14 - 54 U/L 11(L)    Discharge instruction: per After Visit Summary and "Baby and Me Booklet".  After visit meds:  Allergies as of 07/14/2017      Reactions  Compazine [prochlorperazine Edisylate] Anxiety   Phenergan [promethazine] Anxiety   Sulfa Antibiotics Rash      Medication List    TAKE these medications   docusate sodium 100 MG capsule Commonly known as:  COLACE Take 1 capsule (100 mg total) by mouth 2 (two) times daily.   ferrous sulfate 325 (65 FE) MG tablet Take 1 tablet (325 mg total) by mouth daily with breakfast.   ibuprofen 600 MG tablet Commonly known as:  ADVIL,MOTRIN Take 1 tablet (600 mg total) by mouth every 6 (six) hours.   prenatal multivitamin Tabs tablet Take 3 tablets by mouth daily.       Diet: routine diet  Activity: Advance as tolerated. Pelvic rest for 6 weeks.   Outpatient follow up:6 weeks Follow up Appt:No future appointments. Follow up Visit:No Follow-up on file.  Postpartum contraception: Undecided  Newborn Data: Live born female  Birth Weight: 8 lb 8 oz (3856 g) APGAR: 8, 9  Newborn Delivery   Birth date/time:  07/13/2017 16:08:00 Delivery type:  Vaginal, Spontaneous Delivery      Baby Feeding: Breast Disposition:home with mother   07/14/2017 [name], M, DO

## 2017-07-14 NOTE — Progress Notes (Signed)
Postpartum Note Day # 1  S:  Patient resting comfortable in bed.  Pain controlled.  Tolerating general diet. + flatus, no BM.  Lochia moderate.  Ambulating without difficulty.  She denies n/v/f/c, SOB, or CP.  Pt plans on breastfeeding.  O: Temp:  [98 F (36.7 C)-99.3 F (37.4 C)] 98.1 F (36.7 C) (10/14 0626) Pulse Rate:  [76-98] 93 (10/14 0626) Resp:  [16-18] 16 (10/14 0626) BP: (96-134)/(70-89) 105/72 (10/14 0626) SpO2:  [99 %-100 %] 99 % (10/13 1101)   Gen: A&Ox3, NAD CV: RRR Resp: CTAB Abdomen: soft, NT, ND Uterus: firm, non-tender, below umbilicus Incision: c/d/i, bandage on Ext: No edema, no calf tenderness bilaterally, SCDs in place  Labs:  CBC Latest Ref Rng & Units 07/14/2017 07/13/2017 04/12/2017  WBC 4.0 - 10.5 K/uL 9.5 7.1 7.6  Hemoglobin 12.0 - 15.0 g/dL 1.6(X) 0.9(U) 10.1(L)  Hematocrit 36.0 - 46.0 % 28.5(L) 31.5(L) 30.4(L)  Platelets 150 - 400 K/uL 163 205 191    A/P: Pt is a 31 y.o. G2P2002 s/p NSVD, PPD#1  - Pain well controlled -GU: UOP is adequate -GI: Tolerating general diet -Activity: encouraged sitting up to chair and ambulation as tolerated -Prophylaxis: early ambulation -Labs: stable as above, plan for iron daily -Baby boy circ to be completed today  DISPO: Consider early discharge home pending baby's status  Samantha Hidalgo, DO [phone] (pager) [phone] (office)

## 2017-07-14 NOTE — Anesthesia Postprocedure Evaluation (Signed)
Anesthesia Post Note  Patient: Camrynn Mcclintic  Procedure(s) Performed: AN AD HOC LABOR EPIDURAL     Patient location during evaluation: Mother Baby Anesthesia Type: Epidural Level of consciousness: awake and alert and oriented Pain management: satisfactory to patient Vital Signs Assessment: post-procedure vital signs reviewed and stable Respiratory status: spontaneous breathing and nonlabored ventilation Cardiovascular status: stable Postop Assessment: no headache, no backache, no signs of nausea or vomiting, adequate PO intake and patient able to bend at knees (patient up walking) Anesthetic complications: no    Last Vitals:  Vitals:   07/14/17 0014 07/14/17 0626  BP: 111/73 105/72  Pulse: 86 93  Resp: 16 16  Temp: 37.1 C 36.7 C  SpO2:      Last Pain:  Vitals:   07/14/17 0626  TempSrc: Oral  PainSc:    Pain Goal:                 [name]

## 2017-07-14 NOTE — Discharge Instructions (Signed)

## 2017-10-08 ENCOUNTER — Encounter (HOSPITAL_COMMUNITY): Payer: Self-pay

## 2018-02-19 IMAGING — US US ABDOMEN LIMITED
1 series · 14 of 25 positions shown · non-contrast
Comparison: None.

CLINICAL DATA: Right upper quadrant pain for 1-2 weeks in a
pregnant patient.

EXAM:
ULTRASOUND ABDOMEN LIMITED RIGHT UPPER QUADRANT

[Series 1: us abdomen limited · 0.19mm/px · 14 of 27 slices shown]
[im 1/27]
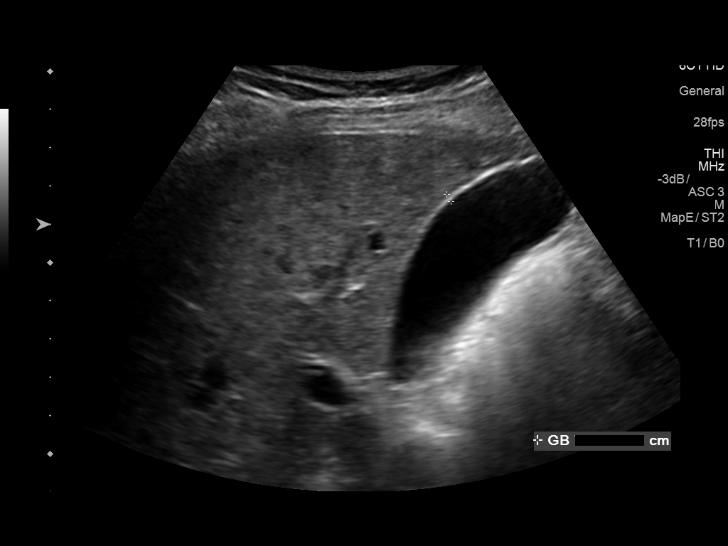
[im 3/27]
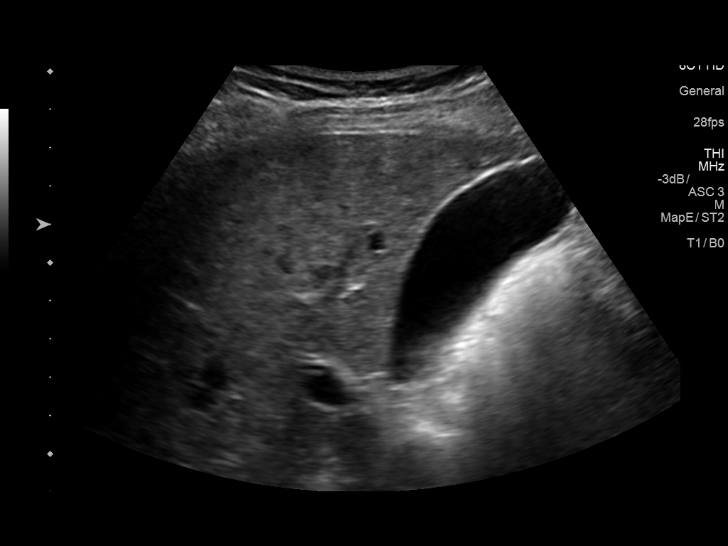
[im 5/27]
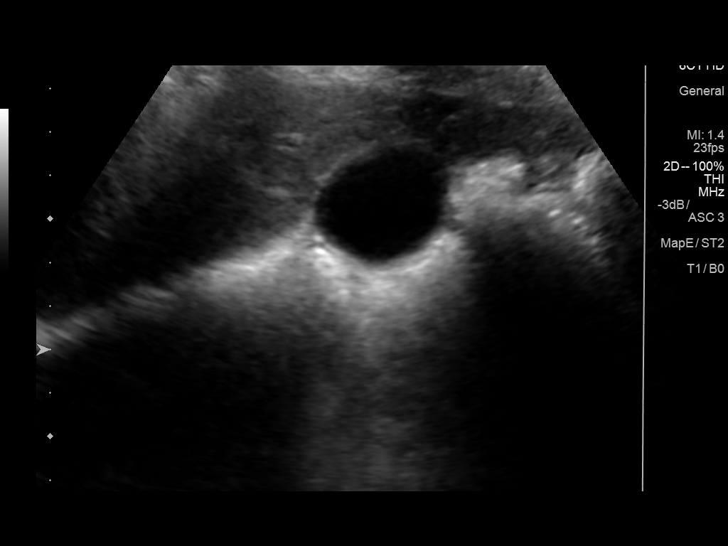
[im 7/27]
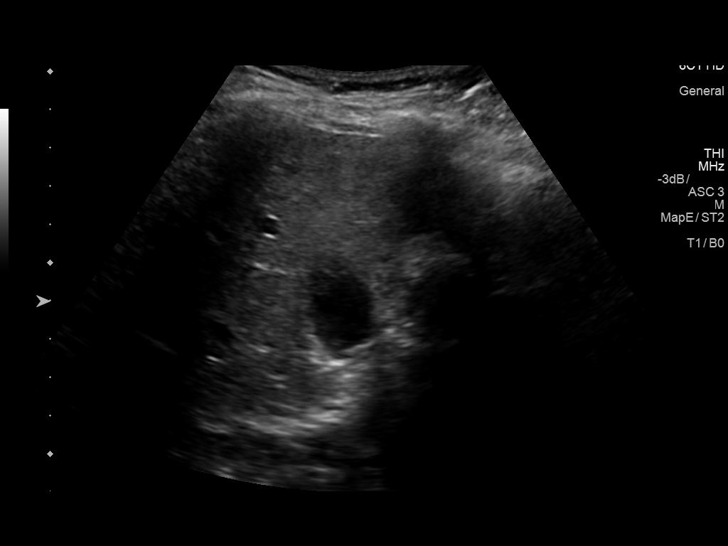
[im 9/27]
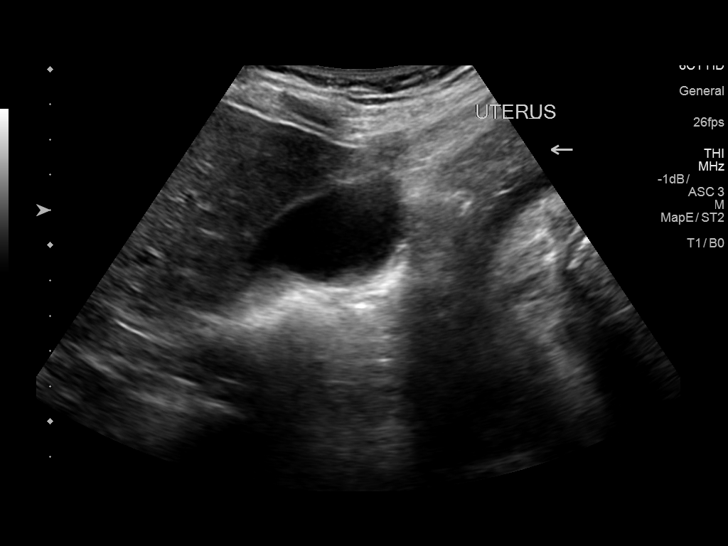
[im 10/27]
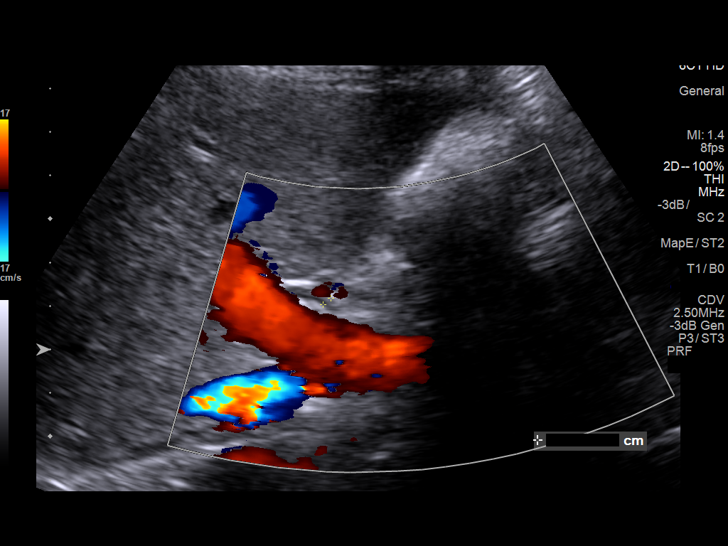
[im 12/27]
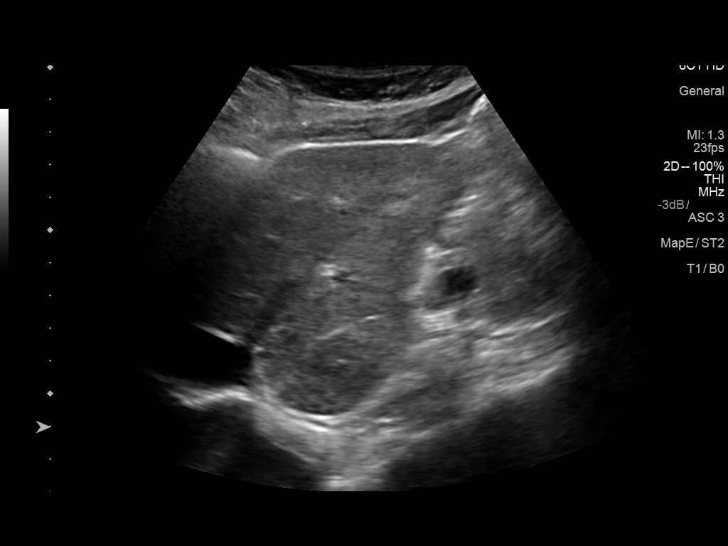
[im 15/27]
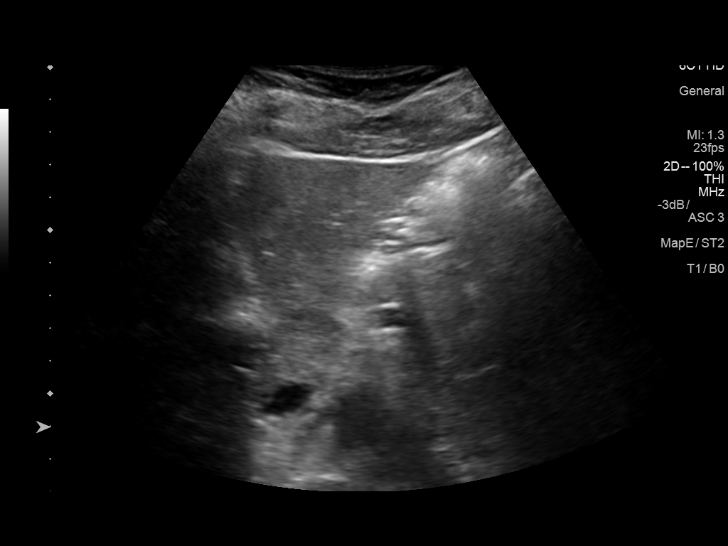
[im 17/27]
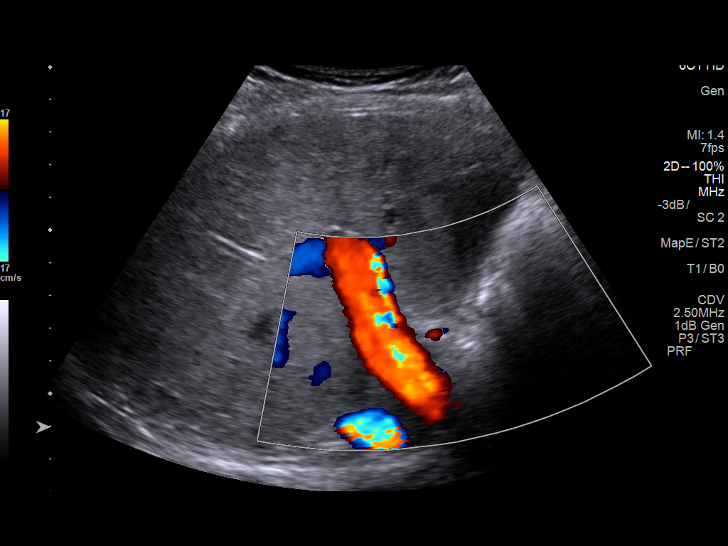
[im 18/27]
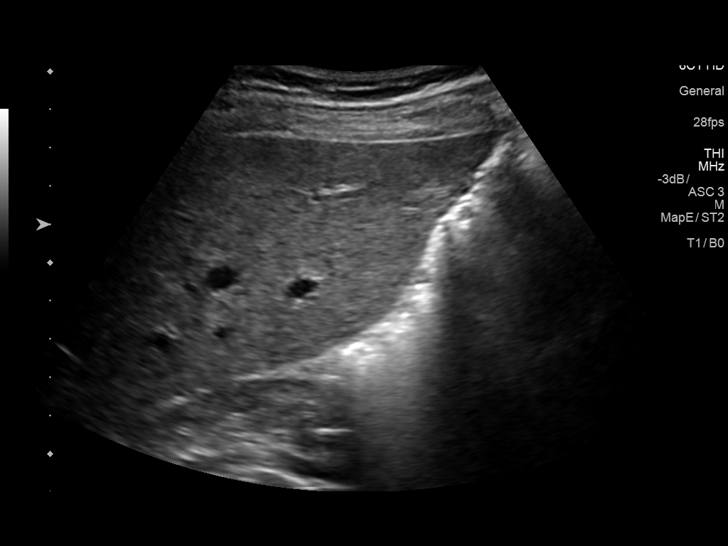
[im 20/27]
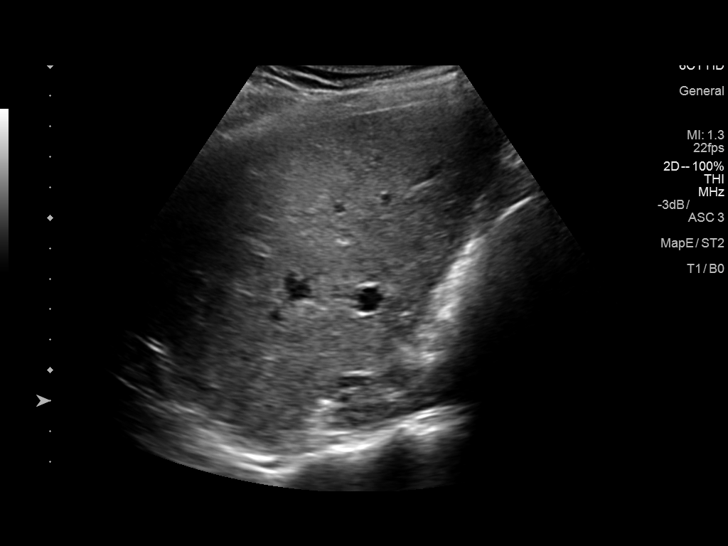
[im 22/27]
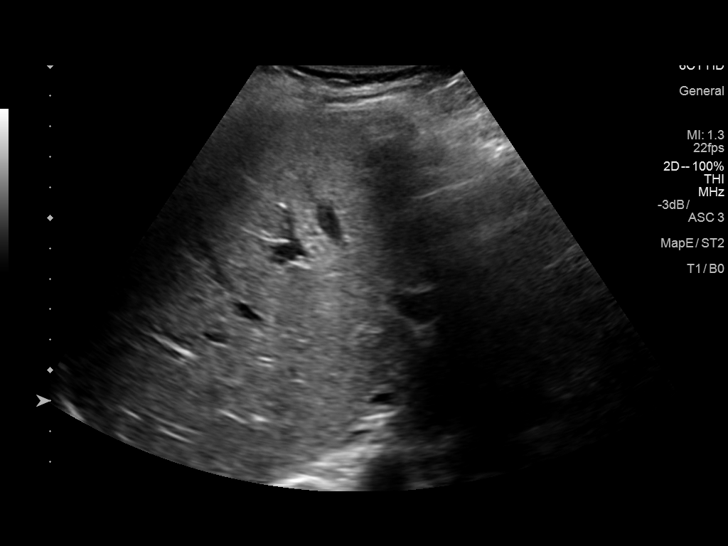
[im 24/27]
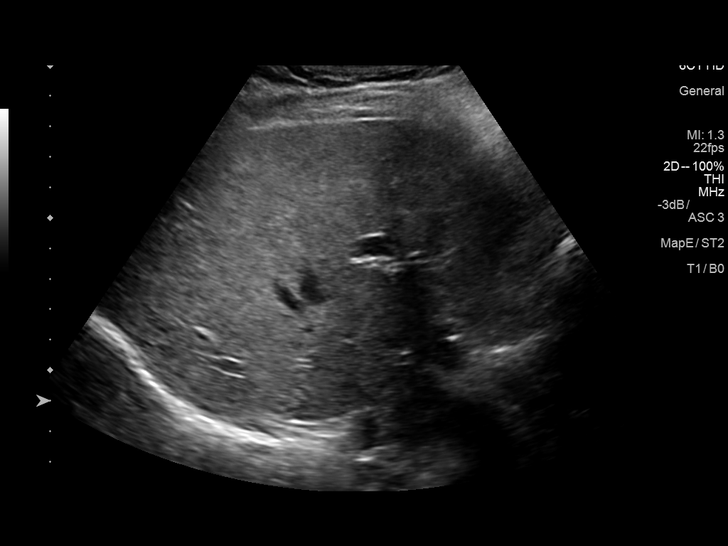
[im 27/27]
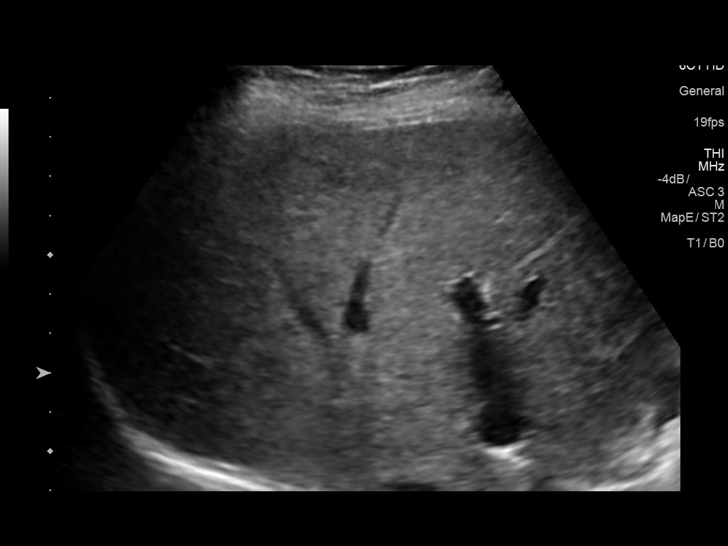

[14 of 25 positions shown; findings below may reference images not displayed]

FINDINGS: Gallbladder:

No gallstones or wall thickening visualized. No sonographic Murphy
sign noted by sonographer.

Common bile duct:

Diameter: 0.2 cm

Liver:

No focal lesion identified. Within normal limits in parenchymal
echogenicity. There is normal hepatopetal flow in the portal vein.
IMPRESSION: Normal exam.

## 2018-04-24 ENCOUNTER — Inpatient Hospital Stay
Admit: 2018-04-24 | Discharge: 2018-04-24 | Disposition: A | Discharge status: Home / Self Care | Payer: Legal Liability / Liability Insurance

## 2018-04-24 DIAGNOSIS — S0990XA Unspecified injury of head, initial encounter: Secondary | ICD-10-CM

## 2018-04-24 NOTE — Unmapped (Signed)
-   tylenol/ibuprofen as needed for pain/headaches    - return for worsening symptoms, concerns
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
## 2020-05-29 ENCOUNTER — Inpatient Hospital Stay
Admission: EM | Admit: 2020-05-29 | Discharge: 2020-05-31 | Disposition: A | Discharge status: Home / Self Care | Payer: PRIVATE HEALTH INSURANCE

## 2020-05-29 ENCOUNTER — Emergency Department: Admit: 2020-05-29 | Payer: PRIVATE HEALTH INSURANCE

## 2020-05-29 ENCOUNTER — Emergency Department: Admit: 2020-05-30 | Payer: PRIVATE HEALTH INSURANCE

## 2020-05-29 DIAGNOSIS — O26899 Other specified pregnancy related conditions, unspecified trimester: Secondary | ICD-10-CM

## 2020-05-29 MED ORDER — fentaNYL (SUBLIMAZE) injection 50 mcg
50 | Freq: Once | INTRAMUSCULAR | Status: AC
Start: 2020-05-29 — End: 2020-05-29
  Administered 2020-05-30: 01:00:00 50 ug via INTRAVENOUS

## 2020-05-29 MED ORDER — acetaminophen (TYLENOL) tablet 975 mg
325 | Freq: Once | ORAL | Status: AC
Start: 2020-05-29 — End: 2020-05-29
  Administered 2020-05-30: 04:00:00 975 mg via ORAL

## 2020-05-29 MED FILL — TYLENOL 325 MG TABLET: 325 325 mg | ORAL | Qty: 3

## 2020-05-29 MED FILL — FENTANYL (PF) 50 MCG/ML INJECTION SOLUTION: 50 50 mcg/mL | INTRAMUSCULAR | Qty: 2

## 2020-05-29 NOTE — Unmapped (Signed)
Pt arrived to ED with c/o chest pain on her right sided that started earlier today. Pt states laying down makes the pain better. Pt describes pain as sharp that radiates down her right arm and rated 8/10.

## 2020-05-29 NOTE — Unmapped (Addendum)
Pt at bedside for [place] and pelvic exam.

## 2020-05-29 NOTE — Unmapped (Signed)
[place] ED Note    Date of Service: 05/29/2020    Reason for Visit: Chest Pain      Patient History     HPI:  Tanya Dunn is a 34 y.o. female with no significant past medical history presenting for chest pain.  The patient presents for acute onset of chest pain today with right-sided, constant, severe and feels like something is caving in my chest.  Denies shortness of breath.  Denies exertional symptoms.  Reports nausea without emesis.  Denies abdominal pain.  Denies fever or cough.  She denies any hormone usage.  She does report that she arrived home from a work trip in [place] 4 days ago.  Denies any lower extremity swelling or pain. No other aggravating or alleviating factors.      No past medical history on file.    Past Surgical History:   Procedure Laterality Date   ??? APPENDECTOMY         Tanya Dunn  reports that she has never smoked. She has never used smokeless tobacco. She reports current alcohol use. She reports that she does not use drugs.    Patient's Medications   New Prescriptions    No medications on file   Previous Medications    DICLOXACILLIN (DYNAPEN) 500 MG CAPSULE        IBUPROFEN (ADVIL,MOTRIN) 600 MG TABLET    Take by mouth.    PRENATAL VIT-IRON FUMARATE-FA 27-1 MG TAB    Take by mouth.   Modified Medications    No medications on file   Discontinued Medications    No medications on file       Allergies:   Allergies as of 05/29/2020 - Fully Reviewed 05/29/2020   Allergen Reaction Noted   ??? Prochlorperazine  02/24/2014   ??? Promethazine Hives 02/24/2014   ??? Sulfa (sulfonamide antibiotics) Rash 02/24/2014       Review of Systems     ROS: Reports chest pain and nausea.  Denies abdominal pain, shortness of breath, fever, lower extremity swelling and pain. All pertinent positives and negatives are listed in the HPI. All other systems were negative.      Physical Exam     ED Triage Vitals   Vital Signs Group      Temp 05/29/20 1936 98.8 ??F (37.1 ??C)      Temp src --       Heart Rate 05/29/20  1924 88      Heart Rate Source --       Resp 05/29/20 1936 19      SpO2 05/29/20 1924 95 %      BP 05/29/20 2013 (!) 129/97      MAP (mmHg) 05/29/20 2013 106      BP Location 05/29/20 1936 Right arm      BP Method 05/29/20 1936 Automatic      Patient Position 05/29/20 1936 Lying   SpO2 05/29/20 1924 95 %   O2 Device 05/29/20 1936 None (Room air)     Vitals:    05/29/20 2207 05/29/20 2223 05/29/20 2231 05/29/20 2314   BP: 121/81 119/68 105/71 (!) 126/91   BP Location: Right arm   Right arm   Patient Position: Lying   Lying   Pulse: 83 68 82 85   Resp: 23 18 18 18    Temp:       SpO2: 98% 100% 100% 98%   Weight:       Height:  Temp Readings from Last 2 Encounters:   05/29/20 98.8 ??F (37.1 ??C)   04/24/18 98 ??F (36.7 ??C) (Oral)     BP Readings from Last 2 Encounters:   05/29/20 (!) 126/91   04/24/18 116/82     Pulse Readings from Last 2 Encounters:   05/29/20 85   04/24/18 76        General: Well-appearing, tall female, no acute distress    HEENT:  Head atraumatic, pupils equal round and reactive to light, extraocular movements intact, sclera clear, mucus membranes moist, oropharynx nonerythematous    Neck:  Supple, no lymphadenopathy    Pulmonary:   Clear to auscultation bilaterally, no wheezes, rhonchi, or rales    Cardiac:  Regular rate and rhythm, normal S1S2, no murmurs, rubs, or gallops    Gastrointestinal:  Soft, nontender, nondistended, no rebound and no guarding, no masses, no organomegaly    Musculoskeletal:  No obvious deformities, no obvious edema, no tenderness to palpation    Vascular:  2+ radial and posterior tibial pulses bilaterally    Skin:  Warm, dry, well perfused, no rashes    Neuro:  AAOx4.  CN 2-12 intact.  Normal gait.  Sensation intact.  Strength grossly equal and symmetric.    GU: Normal external female genitalia without rashes or lesions.  There is scant thin cervical discharge without bleeding.  The cervical os is open to 0.5 cm.  There is no cervical motion tenderness.  There is no  adnexal tenderness or masses appreciated. (This exam was performed with a chaperone present.)          Diagnostic Studies     Labs:    Labs Reviewed   BASIC METABOLIC PANEL - Abnormal; Notable for the following components:       Result Value    Glucose 105 (*)     All other components within normal limits   DIFFERENTIAL - Abnormal; Notable for the following components:    Basophils Relative 1.2 (*)     All other components within normal limits   HCG, SERUM, QUALITATIVE [facility] ONLY - Abnormal; Notable for the following components:    Preg, Serum POSITIVE (*)     All other components within normal limits   B NATRIURETIC PEPTIDE   CBC   HIGH SENSITIVITY TROPONIN   DDIMER   HCG, QUANTITATIVE, PREGNANCY   HIGH SENSITIVITY TROPONIN   ED ECG 12-LEAD (MUSE)    Narrative:     Ventricular Rate:  64  BPM  Atrial Rate:  64  BPM  P-R Interval:  128  ms  QRS Duration:  72  ms  QT:  402  ms  QTc:  414  ms  P Axis:  37  degrees  R Axis:  50  degrees  T Axis:  72  degrees  Diagnosis Line:  NORMAL SINUS RHYTHM ^ POSSIBLE LEFT ATRIAL ENLARGEMENT ^ BORDERLINE ECG   ABO/RH   ANTIBODY SCREEN    Narrative:     Testing performed by [facility]        Radiology:    Please see electronic medical record for any tests performed in the ED    EKG:    Indication: Chest pain, Rate: 82, Rhythm: Sinus rhythm, Interval: PR 132, QRS 76, QTc 425, Axis: Normal axis, ST Segment Change: No ST elevation, T wave inversions in lead V1, V2 and Comparison to prior EKG: No prior EKG          ED Course and MDM  Tanya Dunn is a 34 y.o. female patient who presented to the emergency department with chest pain.      ED Course as of May 30 2327   Wynelle Link May 29, 2020   2050 Patient presents to the ED with acute onset of right-sided chest pain that started today.  She just completed a work trip where she drove to [place] and got home 4 days ago.  She states that she has not take anything for the pain.  She has no lower extremity swelling.  States the pain  is constant and nonradiating and has no alleviating or exacerbating factors.  The patient was given fentanyl for pain.  Given her recent travel and description of the pain, I am concerned for potential PE.  I will obtain a CT scan of the chest.    [KT]   2050 IMPRESSION:   No acute cardiopulmonary abnormality.    X-ray Chest PA and Lateral [KT]   2102 Unremarkable   CBC:    WBC 5.7   RBC 4.42   Hemoglobin 12.7   Hematocrit 37.3   MCV 84.3   MCH 28.8   MCHC 34.2   RDW 14.0   Platelet Count 175   MPV 7.7 [KT]   2109 Unremarkable.   Basic metabolic panel(!):    Sodium 140   Potassium 3.5   Chloride 104   Carbon Dioxide (CO2) 26   Anion Gap 10   BUN 19   Creatinine 0.81   Glucose 105(!)   Calcium 9.9   Calculated Osmolality, Serum 293   eGFR AA CKD-EPI >90   eGFR NONAA CKD-EPI >90 [KT]   2112 Unremarkable.   BNP: 8 [KT]   2117 We will repeat.   High Sensitivity Troponin: 4 [KT]   2117 Preg, Serum(!!): POSITIVE [KT]   2118 The patient was found to be pregnant.  I initially had ordered the CT scan to be performed without additional diagnostics.  But given the patient is not tachycardic and now is pregnant.  I will obtain a D-dimer for further evaluation.    [KT]   2122 I notified the patient of the positive pregnancy test and she states that she has been having abnormal vaginal bleeding since 7/30. She saw her GYN on 8/17 and was found to have an elevated prolactin level.     [KT]   2122 Given the positive pregnancy test, the patient be a G4 P2-0-1-2    [KT]   2149 D-dimer is negative.  This lowers my concern for PE.  Given the positive pregnancy test I will hold off on obtaining a CTPA.   D-Dimer: 0.46 [KT]   2230 There is came to me and stated the patient syncopized while in the bathroom.  She states the patient got up to go the bathroom was fine and she heard a thud while the patient was in the bathroom and found the patient squatting on the floor against the door.  Patient does complain of a right posterior headache.   She has no obvious signs of trauma or hematoma.  She is awake alert and at her baseline mental status with a nonfocal neurologic examination.  Given the fact that she has a new positive pregnancy test with a syncopal episode, is concern for potential ruptured ectopic pregnancy.  I performed a fast examination which showed no evidence of free fluid and my point-of-care ultrasound did not demonstrate an IUP.  Given my concern for an ectopic pregnancy, I did order a  radiology performed ultrasound.    [KT]   2245 Repeat EKG was obtained which shows no ST elevation or significant dysrhythmia.  It also appears to be unchanged from the earlier EKG.    [KT]   2249 Delta of 0.   High Sensitivity Troponin: 4 [KT]   2249 hCG Quant: 4,367.00 [KT]   2249 Does not need RhoGam.   Rh Type: Positive [KT]   2324 The patient initially came in for right-sided chest pain that started today with recent travel to [place] by car.  Initially was going to evaluate for potential pulmonary embolism but found the patient to be pregnant which was a surprise to her.  She has been having vaginal bleeding since July 30.  She saw her gynecologist and was found to have an elevated prolactin and was referred to endocrine for further evaluation.  Because the patient was found to be pregnant I did stop a CT scan and decided to get a D-dimer to further restratify and potentially limit radiation exposure.  The D-dimer was found to be negative this does lower my concern for pulmonary embolism.  The patient would like to avoid radiation exposure if possible.  While waiting for additional laboratory studies, the patient has syncopal episode after using the restroom.  She had used the restroom and was washing her hands and had no warning symptoms.    [KT]   2326 She has no obvious signs of trauma.  I merely went to bedside and performed a fast examination which was negative.  I also was not able to document an intrauterine pregnancy.  This now raise my  concern for potential ectopic pregnancy.  Therefore, I ordered a formal radiology performed pelvic ultrasound to evaluate for ectopic pregnancy.  The patient has no signs of trauma but does complain of a slight headache and denies any change of her chest pain or shortness of breath.  She does report that she has crampy abdominal pain now.  She did not have vaginal bleeding on my GU examination but her cervix is open to about 0.5 cm.    [KT]      ED Course User Index  [KT] Felipe Drone, MD          At this time I am going off-service and will be signing out care of this patient to my colleague, Dr. Dorcas Mcmurray, for further care. My colleague's responsibilities will include: follow up ultrasound, patient reassessment and disposition.     Summary of Treatment in ED:  Medications   acetaminophen (TYLENOL) tablet 975 mg (has no administration in time range)   fentaNYL (SUBLIMAZE) injection 50 mcg (50 mcg Intravenous Given 05/29/20 2045)             K. Berdine Dance, MD, MBA     Felipe Drone, MD  05/29/20 2328

## 2020-05-29 NOTE — Unmapped (Signed)
Pt self ambulated to bathroom where she had an unwitnessed syncopal episode. Prior to ambulation, pt reassured this nurse she was not in pain or dizzy and was able to ambulate without assistance. Pt was found in a kneeling position in the bathroom and was wheeled back to room. EKG performed, MD notified. Pt states  I don't know what happened. I felt like I was having some abdominal cramping, like period cramps. I felt dizzy and the next thing I knew you were beating on the door and picked me up. Pt states she is having pain on the back of her head; ice pack placed.

## 2020-05-29 NOTE — Unmapped (Signed)
[facility] ED  Reassessment Note    Date of Service:  05/29/2020    Patient was initially evaluated by Dr. Janee Morn. Please refer to prior documentation for details of the initial history, physical exam, assessment and plan.  Briefly, patient is a 34 year old G3 P2012 who presented to the emergency department with concern for acute onset chest pain.  While being worked up, the patient had a syncopal episode in the bathroom here and is now complaining of abdominal discomfort.  Incidentally, she was found to be pregnant with a quantitative beta-hCG over 4000 and no definitive IUP.  Fast performed by Dr. Janee Morn was reported as negative and the patient was signed out to me pending radiology performed transvaginal ultrasound for rule out ectopic.     I was notified by the radiology technician as soon as the scan was complete and the patient was taken back to the emergency department that her transvaginal ultrasound was concerned for ruptured ectopic.  On my review of the images with the ultrasound technician, the patient appears to have a large amount of heterogeneous clotted blood in the abdomen with additional new blood concerning for hemoperitoneum.  There is no obvious ectopic pregnancy in the uterus is empty with a thickened endometrial stripe.    On my reassessment the bedside, the patient is newly hypotensive and bradycardic.  She is hypotensive into the 80s over 30s to 50s with relative bradycardia in the 50s-70s.  She is now pale and diaphoretic and has been intermittently lethargic.  Additional 18-gauge IVs were placed for large-bore access and she was started on O- blood transfusion while 2 additional type specific units were ordered with the blood bank.  OB/GYN was notified and Dr. Porfirio Oar rapidly presented to bedside to consent the patient for operative exploration.  The patient will receive 2 units of O- PRBCs while her type-specific units are still being typed.  Though she remains hypotensive, her mentation  and coloring is now much better.  I have ordered a repeat CBC in addition to an INR, lactate, and VBG given her sudden change in clinical status.  I also updated her husband, [place], by phone and he is in route to the hospital at this time.      Clinical Impression:  1.  Ruptured ectopic pregnancy  2.  Hemorrhagic shock    Disposition/Plan: Sent to the OR with GYN (Dr. Porfirio Oar)       CRITICAL CARE TIME    I have seen and examined this patient and provided 31 minutes of critical care time exclusive of separately billed procedures and treating other patients.  Critical care was necessary to treat or prevent imminent or life threatening deterioration of the following condition(s): Hemorrhagic shock, ruptured ectopic    Critical care time was spent personally by me on the following activities: blood draw for specimens, discussions with consultants, evaluation of patient's resonse to treatment, ordering and performing treatments and interventions, ordering and review of laboratory studies, ordering and review of radiographic studies, re-evaluation of patient's condition and transfusion of PRBCs.

## 2020-05-30 ENCOUNTER — Observation Stay: Admit: 2020-05-30 | Payer: PRIVATE HEALTH INSURANCE

## 2020-05-30 LAB — 2019 NOVEL CORONAVIRUS (COVID 19), NAA-C: SARS-CoV-2: NEGATIVE

## 2020-05-30 LAB — DIFFERENTIAL
Basophils Absolute: 68 /uL (ref 0–200)
Basophils Relative: 1.2 % — ABNORMAL HIGH (ref 0.0–1.0)
Eosinophils Absolute: 51 /uL (ref 15–500)
Eosinophils Relative: 0.9 % (ref 0.0–8.0)
Lymphocytes Absolute: 1875 /uL (ref 850–3900)
Lymphocytes Relative: 32.9 % (ref 15.0–45.0)
Monocytes Absolute: 393 /uL (ref 200–950)
Monocytes Relative: 6.9 % (ref 0.0–12.0)
Neutrophils Absolute: 3312 /uL (ref 1500–7800)
Neutrophils Relative: 58.1 % (ref 40.0–80.0)
nRBC: 0 /100{WBCs} (ref 0–0)

## 2020-05-30 LAB — CBC
Hematocrit: 37.3 % (ref 35.0–45.0)
Hematocrit: 32.4 % (ref 35.0–45.0)
Hematocrit: 36.6 % (ref 35.0–45.0)
Hemoglobin: 12.7 g/dL (ref 11.7–15.5)
Hemoglobin: 11.1 g/dL (ref 11.7–15.5)
Hemoglobin: 12.9 g/dL (ref 11.7–15.5)
MCH: 28.8 pg (ref 27.0–33.0)
MCH: 29.6 pg (ref 27.0–33.0)
MCH: 30.1 pg (ref 27.0–33.0)
MCHC: 34.2 g/dL (ref 32.0–36.0)
MCHC: 34.2 g/dL (ref 32.0–36.0)
MCHC: 35.3 g/dL (ref 32.0–36.0)
MCV: 84.3 fL (ref 80.0–100.0)
MCV: 86.3 fL (ref 80.0–100.0)
MCV: 85.3 fL (ref 80.0–100.0)
MPV: 7.7 fL (ref 7.5–11.5)
MPV: 7.8 fL (ref 7.5–11.5)
MPV: 7.2 fL — ABNORMAL LOW (ref 7.5–11.5)
Platelets: 175 10E3/uL (ref 140–400)
Platelets: 250 10*3/uL (ref 140–400)
Platelets: 154 10E3/uL (ref 140–400)
RBC: 4.42 10E6/uL (ref 3.80–5.10)
RBC: 3.75 10*6/uL (ref 3.80–5.10)
RBC: 4.29 10E6/uL (ref 3.80–5.10)
RDW: 14 % (ref 11.0–15.0)
RDW: 14.1 % (ref 11.0–15.0)
RDW: 14.1 % (ref 11.0–15.0)
WBC: 5.7 10E3/uL (ref 3.8–10.8)
WBC: 10.3 10*3/uL (ref 3.8–10.8)
WBC: 9.4 10E3/uL (ref 3.8–10.8)

## 2020-05-30 LAB — BASIC METABOLIC PANEL
Anion Gap: 10 mmol/L (ref 3–16)
Anion Gap: 7 mmol/L (ref 3–16)
BUN: 19 mg/dL (ref 7–25)
BUN: 11 mg/dL (ref 7–25)
CO2: 26 mmol/L (ref 21–33)
CO2: 25 mmol/L (ref 21–33)
Calcium: 9.9 mg/dL (ref 8.6–10.3)
Calcium: 9.2 mg/dL (ref 8.6–10.3)
Chloride: 104 mmol/L (ref 98–110)
Chloride: 107 mmol/L (ref 98–110)
Creatinine: 0.81 mg/dL (ref 0.60–1.30)
Creatinine: 0.49 mg/dL — ABNORMAL LOW (ref 0.60–1.30)
Glucose: 105 mg/dL — ABNORMAL HIGH (ref 70–100)
Glucose: 203 mg/dL — ABNORMAL HIGH (ref 70–100)
Osmolality, Calculated: 293 mosm/kg (ref 278–305)
Osmolality, Calculated: 293 mosm/kg (ref 278–305)
Potassium: 3.5 mmol/L (ref 3.5–5.3)
Potassium: 4.1 mmol/L (ref 3.5–5.3)
Sodium: 140 mmol/L (ref 133–146)
Sodium: 139 mmol/L (ref 133–146)
eGFR AA CKD-EPI: >90 See note.
eGFR AA CKD-EPI: >90 See note.
eGFR NONAA CKD-EPI: >90 See note.
eGFR NONAA CKD-EPI: >90 See note.

## 2020-05-30 LAB — VENOUS BLOOD GAS, LINE/SYRINGE
%HBO2-Line Draw: 62.9 % (ref 40.0–70.0)
Base Excess-Line Draw: -1.9 mmol/L (ref <=3.0)
CO2 Content-Line Draw: 25 mmol/L (ref 25–29)
Carboxyhgb-Line Draw: 1.1 %
HCO3-Line Draw: 23 mmol/L (ref 24–28)
Methemoglobin-Line Draw: 0.9 % (ref 0.0–1.5)
PCO2-Line Draw: 41 mm Hg (ref 41–51)
PH-Line Draw: 7.37 (ref 7.32–7.42)
PO2-Line Draw: 35 mm Hg (ref 25–40)
Reduced Hemoglobin-Line Draw: 35.1 % (ref 0.0–5.0)

## 2020-05-30 LAB — PROTIME-INR
INR: 1.1 (ref 0.9–1.1)
Protime: 14 seconds (ref 12.1–15.1)

## 2020-05-30 LAB — ABO/RH
Rh Type: POSITIVE
Rh Type: POSITIVE

## 2020-05-30 LAB — B NATRIURETIC PEPTIDE: BNP: 8 pg/mL (ref 0–100)

## 2020-05-30 LAB — ANTIBODY SCREEN: Antibody Screen: NEGATIVE

## 2020-05-30 LAB — DDIMER: D-Dimer: 0.46 ug{FEU}/mL (ref 0.00–0.50)

## 2020-05-30 LAB — LACTIC ACID, ARTERIAL, WHOLE BLOOD,UCMC: Lactate, Art: 1.7 mmol/L (ref 0.5–1.6)

## 2020-05-30 LAB — HIGH SENSITIVITY TROPONIN
High Sensitivity Troponin: 4 ng/L (ref 0–14)
High Sensitivity Troponin: 4 ng/L (ref 0–14)

## 2020-05-30 LAB — HCG, SERUM, QUALITATIVE: Preg, Serum: POSITIVE — CR

## 2020-05-30 LAB — HCG, QUANTITATIVE, PREGNANCY: hCG Quant: 4367 m[IU]/mL

## 2020-05-30 MED ORDER — ondansetron (ZOFRAN) injection
4 | INTRAMUSCULAR | Status: AC | PRN
Start: 2020-05-30 — End: 2020-05-30
  Administered 2020-05-30: 08:00:00 4 via INTRAVENOUS

## 2020-05-30 MED ORDER — rocuronium (ZEMURON) 10 mg/mL injection
10 | INTRAVENOUS | Status: AC
Start: 2020-05-30 — End: ?

## 2020-05-30 MED ORDER — oxyCODONE (ROXICODONE) immediate release tablet 10 mg
5 | ORAL | Status: AC | PRN
Start: 2020-05-30 — End: 2020-05-31

## 2020-05-30 MED ORDER — oxyCODONE (ROXICODONE) immediate release tablet 5 mg
5 | ORAL | Status: AC | PRN
Start: 2020-05-30 — End: 2020-05-30

## 2020-05-30 MED ORDER — ondansetron (ZOFRAN) injection 4 mg
4 | Freq: Three times a day (TID) | INTRAMUSCULAR | Status: AC | PRN
Start: 2020-05-30 — End: 2020-05-30

## 2020-05-30 MED ORDER — HYDROmorphone (DILAUDID) injection Syrg 0.2 mg
0.5 | INTRAMUSCULAR | Status: AC | PRN
Start: 2020-05-30 — End: 2020-05-30
  Administered 2020-05-30: 09:00:00 0.2 mg via INTRAVENOUS

## 2020-05-30 MED ORDER — fentaNYL (SUBLIMAZE) 50 mcg/mL injection
50 | INTRAMUSCULAR | Status: AC
Start: 2020-05-30 — End: ?

## 2020-05-30 MED ORDER — ibuprofen (MOTRIN) tablet 800 mg
400 | Freq: Three times a day (TID) | ORAL | Status: AC
Start: 2020-05-30 — End: 2020-05-31
  Administered 2020-05-30: 11:00:00 800 mg via ORAL

## 2020-05-30 MED ORDER — lactated Ringers infusion
INTRAVENOUS | Status: AC
Start: 2020-05-30 — End: 2020-05-30

## 2020-05-30 MED ORDER — calcium gluconate 100 mg/mL (10%) injection
100 | INTRAVENOUS | Status: AC
Start: 2020-05-30 — End: 2020-05-30

## 2020-05-30 MED ORDER — propofol 10 mg/ml (DIPRIVAN) 10 mg/mL injection
10 | INTRAVENOUS | Status: AC
Start: 2020-05-30 — End: ?

## 2020-05-30 MED ORDER — fentaNYL (SUBLIMAZE) injection
50 | INTRAMUSCULAR | Status: AC | PRN
Start: 2020-05-30 — End: 2020-05-30
  Administered 2020-05-30: 09:00:00 50 via INTRAVENOUS
  Administered 2020-05-30 (×2): 25 via INTRAVENOUS

## 2020-05-30 MED ORDER — silver nitrate applicators 75-25 %
75-25 | TOPICAL | Status: AC
Start: 2020-05-30 — End: 2020-05-30

## 2020-05-30 MED ORDER — calcium gluconate 100 mg/mL (10%) injection
100 | INTRAVENOUS | Status: AC | PRN
Start: 2020-05-30 — End: 2020-05-30
  Administered 2020-05-30 (×2): 1 via INTRAVENOUS

## 2020-05-30 MED ORDER — sodium chloride 0.9 % infusion
INTRAVENOUS | Status: AC
Start: 2020-05-30 — End: 2020-05-30

## 2020-05-30 MED ORDER — ondansetron (ZOFRAN) 4 mg/2 mL injection
4 | INTRAMUSCULAR | Status: AC
Start: 2020-05-30 — End: ?

## 2020-05-30 MED ORDER — lactated Ringers infusion
INTRAVENOUS | Status: AC | PRN
Start: 2020-05-30 — End: 2020-05-30
  Administered 2020-05-30: 07:00:00 via INTRAVENOUS

## 2020-05-30 MED ORDER — lidocaine (PF) 2% (20 mg/mL) 20 mg/mL (2 %) Soln
20 | INTRAMUSCULAR | Status: AC
Start: 2020-05-30 — End: ?

## 2020-05-30 MED ORDER — simethicone (MYLICON) chewable tablet 80 mg
80 | ORAL | Status: AC | PRN
Start: 2020-05-30 — End: 2020-05-31

## 2020-05-30 MED ORDER — acetaminophen (TYLENOL) tablet 650 mg
325 | Freq: Four times a day (QID) | ORAL | Status: AC
Start: 2020-05-30 — End: 2020-05-31
  Administered 2020-05-30 – 2020-05-31 (×6): 650 mg via ORAL

## 2020-05-30 MED ORDER — fentaNYL (SUBLIMAZE) injection 12.5 mcg
50 | INTRAMUSCULAR | Status: AC | PRN
Start: 2020-05-30 — End: 2020-05-30

## 2020-05-30 MED ORDER — fentaNYL (SUBLIMAZE) injection 25 mcg
50 | Freq: Once | INTRAMUSCULAR | Status: AC
Start: 2020-05-30 — End: 2020-05-30
  Administered 2020-05-30: 06:00:00 25 ug via INTRAVENOUS

## 2020-05-30 MED ORDER — neostigmine methylsulfate (PROSTIGMIN) IV solution
1 | INTRAVENOUS | Status: AC | PRN
Start: 2020-05-30 — End: 2020-05-30
  Administered 2020-05-30: 09:00:00 3.5 via INTRAVENOUS

## 2020-05-30 MED ORDER — succinylcholine (QUELICIN) injection
20 | INTRAMUSCULAR | Status: AC | PRN
Start: 2020-05-30 — End: 2020-05-30
  Administered 2020-05-30: 07:00:00 120 via INTRAVENOUS

## 2020-05-30 MED ORDER — HYDROmorphone (DILAUDID) injection Syrg 0.5 mg
0.5 | INTRAMUSCULAR | Status: AC | PRN
Start: 2020-05-30 — End: 2020-05-30

## 2020-05-30 MED ORDER — dexamethasone (DECADRON) injection
4 | INTRAMUSCULAR | Status: AC | PRN
Start: 2020-05-30 — End: 2020-05-30
  Administered 2020-05-30: 08:00:00 8 via INTRAVENOUS

## 2020-05-30 MED ORDER — sodium chloride 0.9 % irrigation
0.9 | Status: AC | PRN
Start: 2020-05-30 — End: 2020-05-30
  Administered 2020-05-30: 08:00:00 3000
  Administered 2020-05-30: 08:00:00 1000

## 2020-05-30 MED ORDER — polyethylene glycol (MIRALAX) packet 17 g
17 | Freq: Every day | ORAL | Status: AC
Start: 2020-05-30 — End: 2020-05-31
  Administered 2020-05-31: 13:00:00 17 g via ORAL

## 2020-05-30 MED ORDER — sodium chloride 0.9 % infusion
INTRAVENOUS | Status: AC
Start: 2020-05-30 — End: 2020-05-30
  Administered 2020-05-30: 11:00:00 50 mL/h via INTRAVENOUS

## 2020-05-30 MED ORDER — naloxone (NARCAN) injection 0.04 mg
0.4 | INTRAMUSCULAR | Status: AC | PRN
Start: 2020-05-30 — End: 2020-05-30

## 2020-05-30 MED ORDER — rocuronium (ZEMURON) injection
10 | INTRAVENOUS | Status: AC | PRN
Start: 2020-05-30 — End: 2020-05-30
  Administered 2020-05-30: 08:00:00 20 via INTRAVENOUS
  Administered 2020-05-30: 07:00:00 30 via INTRAVENOUS

## 2020-05-30 MED ORDER — bupivacaine (PF) (SENSORCAINE) 0.5 % (5 mg/mL) Soln
0.5 | INTRAMUSCULAR | Status: AC
Start: 2020-05-30 — End: 2020-05-30

## 2020-05-30 MED ORDER — calcium gluconate in sodium chloride, iso-osm 1 gram/50 mL IV solution 1 g
1 | Freq: Once | INTRAVENOUS | Status: AC
Start: 2020-05-30 — End: 2020-05-30
  Administered 2020-05-30: 07:00:00 1 g via INTRAVENOUS

## 2020-05-30 MED ORDER — ondansetron (ZOFRAN) injection 4 mg
4 | Freq: Three times a day (TID) | INTRAMUSCULAR | Status: AC | PRN
Start: 2020-05-30 — End: 2020-05-31

## 2020-05-30 MED ORDER — glycopyrrolate (ROBINUL) injection
0.2 | INTRAMUSCULAR | Status: AC | PRN
Start: 2020-05-30 — End: 2020-05-30
  Administered 2020-05-30: 09:00:00 .6 via INTRAVENOUS

## 2020-05-30 MED ORDER — HYDROmorphone (DILAUDID) injection Syrg 0.2 mg
0.5 | INTRAMUSCULAR | Status: AC | PRN
Start: 2020-05-30 — End: 2020-05-30

## 2020-05-30 MED ORDER — bupivacaine HCl (MARCAINE) 0.5 % (5 mg/mL) injection
0.5 | INTRAMUSCULAR | Status: AC | PRN
Start: 2020-05-30 — End: 2020-05-30
  Administered 2020-05-30: 08:00:00 8

## 2020-05-30 MED ORDER — lidocaine (PF) 20 mg/mL (2 %) Soln
20 | INTRAVENOUS | Status: AC | PRN
Start: 2020-05-30 — End: 2020-05-30
  Administered 2020-05-30: 07:00:00 100 via INTRAVENOUS

## 2020-05-30 MED ORDER — lactated Ringers infusion
INTRAVENOUS | Status: AC
Start: 2020-05-30 — End: 2020-05-30
  Administered 2020-05-30: 06:00:00 100 mL/h via INTRAVENOUS

## 2020-05-30 MED ORDER — oxyCODONE (ROXICODONE) immediate release tablet 5 mg
5 | ORAL | Status: AC | PRN
Start: 2020-05-30 — End: 2020-05-31
  Administered 2020-05-30 – 2020-05-31 (×6): 5 mg via ORAL

## 2020-05-30 MED ORDER — midazolam (PF) (VERSED) injection
1 | INTRAMUSCULAR | Status: AC | PRN
Start: 2020-05-30 — End: 2020-05-30
  Administered 2020-05-30 (×2): 1 via INTRAVENOUS

## 2020-05-30 MED ORDER — oxyCODONE (ROXICODONE) immediate release tablet 10 mg
5 | ORAL | Status: AC | PRN
Start: 2020-05-30 — End: 2020-05-30

## 2020-05-30 MED ORDER — midazolam (PF) (VERSED) 1 mg/mL injection
1 | INTRAMUSCULAR | Status: AC
Start: 2020-05-30 — End: ?

## 2020-05-30 MED ORDER — fentaNYL (SUBLIMAZE) injection 25 mcg
50 | INTRAMUSCULAR | Status: AC | PRN
Start: 2020-05-30 — End: 2020-05-30

## 2020-05-30 MED ORDER — propofol 10 mg/ml (DIPRIVAN) injection
10 | INTRAVENOUS | Status: AC | PRN
Start: 2020-05-30 — End: 2020-05-30
  Administered 2020-05-30: 07:00:00 150 via INTRAVENOUS

## 2020-05-30 MED ORDER — ketorolac (TORADOL) injection 30 mg
30 | Freq: Three times a day (TID) | INTRAMUSCULAR | Status: AC
Start: 2020-05-30 — End: 2020-05-31
  Administered 2020-05-31: 09:00:00 30 mg via INTRAVENOUS

## 2020-05-30 MED FILL — HYDROMORPHONE 0.5 MG/0.5 ML INJECTION SYRINGE: 0.5 0.5 mg/0.5 mL | INTRAMUSCULAR | Qty: 0.5

## 2020-05-30 MED FILL — IBUPROFEN 800 MG TABLET: 800 800 MG | ORAL | Qty: 1

## 2020-05-30 MED FILL — PROPOFOL 10 MG/ML INTRAVENOUS EMULSION: 10 10 mg/mL | INTRAVENOUS | Qty: 20

## 2020-05-30 MED FILL — ROCURONIUM 10 MG/ML INTRAVENOUS SOLUTION: 10 10 mg/mL | INTRAVENOUS | Qty: 5

## 2020-05-30 MED FILL — TYLENOL 325 MG TABLET: 325 325 mg | ORAL | Qty: 2

## 2020-05-30 MED FILL — POLYETHYLENE GLYCOL 3350 17 GRAM ORAL POWDER PACKET: 17 17 gram | ORAL | Qty: 1

## 2020-05-30 MED FILL — SODIUM CHLORIDE 0.9 % INTRAVENOUS SOLUTION: 50.00 50.00 mL/hr | INTRAVENOUS | Qty: 1000

## 2020-05-30 MED FILL — SODIUM CHLORIDE 0.9 % INTRAVENOUS SOLUTION: 20.00 20.00 mL/hr | INTRAVENOUS | Qty: 1000

## 2020-05-30 MED FILL — IBUPROFEN 400 MG TABLET: 400 400 MG | ORAL | Qty: 2

## 2020-05-30 MED FILL — SENSORCAINE-MPF 0.5 % (5 MG/ML) INJECTION SOLUTION: 0.5 0.5 % (5 mg/mL) | INTRAMUSCULAR | Qty: 30

## 2020-05-30 MED FILL — FENTANYL (PF) 50 MCG/ML INJECTION SOLUTION: 50 50 mcg/mL | INTRAMUSCULAR | Qty: 2

## 2020-05-30 MED FILL — LACTATED RINGERS INTRAVENOUS SOLUTION: INTRAVENOUS | Qty: 1000

## 2020-05-30 MED FILL — SILVER NITRATE APPLICATORS 75 %-25 % TOPICAL STICK: 75-25 75-25 % | TOPICAL | Qty: 1

## 2020-05-30 MED FILL — OXYCODONE 5 MG TABLET: 5 5 MG | ORAL | Qty: 1

## 2020-05-30 MED FILL — ONDANSETRON HCL (PF) 4 MG/2 ML INJECTION SOLUTION: 4 4 mg/2 mL | INTRAMUSCULAR | Qty: 2

## 2020-05-30 MED FILL — CALCIUM GLUCONATE 1 GRAM/50 ML IN SODIUM CHLORIDE, ISO-OSM IV SOLUTION: 1 1 gram/50 mL | INTRAVENOUS | Qty: 50

## 2020-05-30 MED FILL — CALCIUM GLUCONATE 100 MG/ML (10 %) INTRAVENOUS SOLUTION: 100 100 mg/mL (10%) | INTRAVENOUS | Qty: 2

## 2020-05-30 MED FILL — SODIUM CHLORIDE 0.9 % INTRAVENOUS SOLUTION: INTRAVENOUS | Qty: 1000

## 2020-05-30 MED FILL — LACTATED RINGERS INTRAVENOUS SOLUTION: 100.00 100.00 mL/hr | INTRAVENOUS | Qty: 1000

## 2020-05-30 MED FILL — LIDOCAINE (PF) 20 MG/ML (2 %) INJECTION SOLUTION: 20 20 mg/mL (2 %) | INTRAMUSCULAR | Qty: 5

## 2020-05-30 MED FILL — MIDAZOLAM (PF) 1 MG/ML INJECTION SOLUTION: 1 1 mg/mL | INTRAMUSCULAR | Qty: 2

## 2020-05-30 MED FILL — KETOROLAC 30 MG/ML (1 ML) INJECTION SOLUTION: 30 30 mg/mL (1 mL) | INTRAMUSCULAR | Qty: 1

## 2020-05-30 NOTE — Unmapped (Signed)
[facility]  DEPARTMENT OF ANESTHESIOLOGY  PRE-PROCEDURAL EVALUATION    Tanya Dunn is a 34 y.o. year old female presenting for:        Surgeon:   * Surgery not found *    Chief Complaint         Review of Systems     Anesthesia Evaluation    Patient summary reviewed and nursing notes reviewed.  All other systems reviewed and are negative.     No history of anesthetic complications   I have reviewed the History and Physical Exam, any relevant changes are noted in the anesthesia pre-operative evaluation.      Cardiovascular:      (-) hypertension, CABG/stent.  ROS comment: No chest pain.  > 4 mets.  No anticoagulation.     Neuro/Muscoloskeletal/Psych:      (-) TIA, CVA.     Pulmonary:      (-) asthma, sleep apnea.       GI/Hepatic/Renal:      (-) GERD, renal disease.    Endo/Other:        (-) hyperthyroidism, no anemia.       Past Medical History     No past medical history on file.    Past Surgical History     Past Surgical History:   Procedure Laterality Date   ??? APPENDECTOMY         Family History     Family History   Problem Relation Age of Onset   ??? Cancer Maternal Aunt    ??? Ovarian cancer Maternal Aunt    ??? Cancer Paternal Aunt    ??? Melanoma Paternal Aunt    ??? Heart disease Maternal Grandfather    ??? Stroke Maternal Grandfather    ??? Heart disease Paternal Grandfather    ??? Stroke Paternal Grandfather        Social History     Social History     Socioeconomic History   ??? Marital status: Married     Spouse name: Not on file   ??? Number of children: Not on file   ??? Years of education: Not on file   ??? Highest education level: Not on file   Occupational History   ??? Not on file   Tobacco Use   ??? Smoking status: Never Smoker   ??? Smokeless tobacco: Never Used   Substance and Sexual Activity   ??? Alcohol use: Yes   ??? Drug use: No   ??? Sexual activity: Yes     Partners: Male   Other Topics Concern   ??? Caffeine Use Not Asked   ??? Occupational Exposure Not Asked   ??? Exercise Not Asked   ??? Seat Belt Not Asked   Social History  Narrative   ??? Not on file     Social Determinants of Health     Financial Resource Strain:    ??? Difficulty of Paying Living Expenses:    Physical Activity:    ??? Days of Exercise per Week:    ??? Minutes of Exercise per Session:    Stress:    ??? Feeling of Stress :    Social Connections:    ??? Frequency of Communication with Friends and Family:    ??? Frequency of Social Gatherings with Friends and Family:    ??? Attends Religious Services:    ??? Database administrator or Organizations:    ??? Attends Banker Meetings:    ??? Marital Status:  Medications     Allergies:  Allergies   Allergen Reactions   ??? Prochlorperazine      Rash   ??? Promethazine Hives     unsure   ??? Sulfa (Sulfonamide Antibiotics) Rash       Home Meds:  Prior to Admission medications as of 05/29/20 1938   Medication Sig Taking?   ibuprofen (ADVIL,MOTRIN) 600 MG tablet Take by mouth. Yes   dicloxacillin (DYNAPEN) 500 MG capsule     prenatal vit-iron fumarate-FA 27-1 mg Tab Take by mouth.        Inpatient Meds:  Scheduled:   ??? lactated Ringers         Continuous:   ??? sodium chloride 0.9 %         PRN:     Vital Signs     Wt Readings from Last 3 Encounters:   05/29/20 155 lb (70.3 kg)   04/24/18 140 lb (63.5 kg)   03/10/14 151 lb (68.5 kg)     Ht Readings from Last 3 Encounters:   05/29/20 5' 3 (1.6 m)   04/24/18 5' 3 (1.6 m)   03/10/14 5' 3 (1.6 m)     Temp Readings from Last 3 Encounters:   05/30/20 98.1 ??F (36.7 ??C) (Oral)   04/24/18 98 ??F (36.7 ??C) (Oral)   03/10/14 98.2 ??F (36.8 ??C) (Oral)     BP Readings from Last 3 Encounters:   05/30/20 107/65   04/24/18 116/82   03/10/14 98/66     Pulse Readings from Last 3 Encounters:   05/30/20 76   04/24/18 76   03/10/14 84     @LASTSAO2 (3)@    Physical Exam     Airway:     Mallampati: II  Mouth Opening: >2 FB  TM distance: > = 3 FB  Neck ROM: full    Dental:   - No obvious cracked, loose, chipped, or missing teeth.         Pulmonary:   - normal exam     Cardiovascular:  - normal exam      Neuro/Musculoskeletal/Psych:  - normal neurological exam.         Abdominal:    - normal exam    Current OB Status:       Other Findings:        Laboratory Data     Lab Results   Component Value Date    WBC 10.3 05/30/2020    HGB 11.1 (L) 05/30/2020    HCT 32.4 (L) 05/30/2020    MCV 86.3 05/30/2020    PLT 250 05/30/2020       No results found for: [facility]    Lab Results   Component Value Date    GLUCOSE 105 (H) 05/29/2020    BUN 19 05/29/2020    CO2 26 05/29/2020    CREATININE 0.81 05/29/2020    K 3.5 05/29/2020    NA 140 05/29/2020    CL 104 05/29/2020    CALCIUM 9.9 05/29/2020       Lab Results   Component Value Date    INR 1.1 05/30/2020       Lab Results   Component Value Date    PREGSERUM POSITIVE (AA) 05/29/2020    HCGQUANT 4,367.00 05/29/2020       Anesthesia Plan     ASA 2 - emergency         Female and current non-smoker    Anesthesia Type:  general.  PONV Risk Factors: female, current non-smoker,  plan for postoperative opioid use.                  (D/w patient benefits/risks of GA for the procedure.  D/w patient benefits/risks of possible arterial line placement, central line placement and remote possibility of post op mechanical ventilation and ICU stay. Patient and surgeon agreeable to the anesthetic plan.     20 gague IV in LUE.  18 Gauge in RUE.    Received 2 units PRBCs in ER and 2 more units on hold for the OR.    Last ate at 6 PM - RSI ET tube.     Type and screened    SR    Covid PENDING  )  Anesthetic plan and risks discussed with patient.    Plan, alternatives, and risks of anesthesia, including death, have been explained to and discussed with the patient/legal guardian.  By my assessment, the patient/legal guardian understands and agrees.  Scenario presented in detail.  Questions answered.          Plan discussed with CRNA.

## 2020-05-30 NOTE — Anesthesia Post-Procedure Evaluation (Signed)
Anesthesia Post Note    Patient: Tanya Dunn    Procedure(s) Performed: Procedure(s):  laparoscopic right salpingectomy, evacuation of blood    Anesthesia type: general    Patient location: PACU    Post pain: Adequate analgesia    Post assessment: no apparent anesthetic complications    Last Vitals:   Vitals:    05/30/20 0515 05/30/20 0520 05/30/20 0525 05/30/20 0530   BP: (!) 238/205 (!) 221/187 110/85 118/84   BP Location: Right arm Right arm Left arm Left arm   Patient Position: Lying Lying Lying Lying   Pulse: 142 145 110 133   Resp: 23 26 23 23    Temp:       TempSrc:       SpO2: 92% 100% 96% 100%   Weight:       Height:            Post vital signs: stable    Level of consciousness: awake    Complications: None     Patient complained per RN of chest pain that worsens with cough/inspiration. Upon arrival and questioning patient denies having complained of chest pain however instead states she feels like she needs to cough something up.  In no acute distress.  Denies any other symptoms.  VSS.  Heart tachy otherwise Normal lungs CTAB AOE x 3 no peripheral edema noted.    EKG ordered revealing no significant acute st t wave changes.  Vitals continue to be stable and physical exam benign.    Reassured patient and instructed she will be admitted and to let us know if anything changes.    All parties agreeable to the plan.

## 2020-05-30 NOTE — Unmapped (Signed)
Anesthesia Transfer of Care Note    Patient: Tanya Dunn  Procedure(s) Performed: Procedure(s):  laparoscopic right salpingectomy, evacuation of blood    Patient location: PACU    Anesthesia type: general    Airway Device on Arrival to PACU/ICU: Non-rebreather Mask    IV Access: Peripheral    Monitors Recommended to be Used During PACU/ICU: Standard Monitors    Outstanding Issues to Address: None    Level of Consciousness: awake, alert  and oriented    Post vital signs:    Vitals:    05/30/20 0455   BP: 109/93   Pulse: 128   Resp: 23   Temp: 96.8 ??F (36 ??C)   SpO2: 98%       Complications: None    Date 05/29/20 0700 - 05/30/20 0659 05/30/20 0700 - 05/31/20 0659   Shift 0700-1459 1500-2259 2300-0659 24 Hour Total 0700-1459 1500-2259 2300-0659 24 Hour Total   INTAKE   I.V.   1800(25.6) 1800(25.6)         Volume (mL) (lactated Ringers infusion)   900 900         Volume (mL) (lactated Ringers infusion)   900 900       Blood   310 310         PRBC - Units   2 x 2 x         FFP - Units   2 x 2 x         Platelets - Units   1 x 1 x         Volume (Transfuse RBC Transfusion Rate: Per dept routine)   310 310       Shift Total(mL/kg)   2110(30) 2110(30)       OUTPUT   Urine   50 50         Urine   50 50       Shift Total(mL/kg)   50(0.7) 50(0.7)       Weight (kg)  70.3 70.3 70.3 70.3 70.3 70.3 70.3

## 2020-05-30 NOTE — Unmapped (Signed)
INTRA-OP POST BRIEFING NOTE: Earnestene Angello      Specimens:   Specimens     ID Source Type Tests Collected By Collected At Frozen? Attributes Order ID Breast Spec Formalin Marked as Sent    A Tissue Tissue ?? SURGICAL PATHOLOGY EXAM   Dorita Fray, MD 05/30/20 0406 No Sent in Formalin ?? [id number]    05/30/20 0418          Prior to leaving the room: Nurse confirmed name of procedure, completion of instrument, sponge & needle counts, reads specimen labels aloud including patient name and addresses any equipment issues? Nurse confirmed wound class. Nurse to surgeon and anesthesia: What are key concerns for recovery and management of the patient?  Yes      Blood products stored at appropriate temperatures prior to return to blood bank (if applicable)? N/A      Patient identification band secured on patient prior to transfer out of the operating room? Yes    Temporary devices implanted for the duration of the surgery removed and evaluated for intactness and completeness prior to closure? N/A      Other Comments:     Signed: Gomez Cleverly    Date: 05/30/2020    Time: 4:18 AM

## 2020-05-30 NOTE — Unmapped (Signed)
Problem: Pain  Goal: Patient's pain is progressing toward patient's stated pain goal  Description: Assess and monitor patient's pain using appropriate pain scale. Collaborate with interdisciplinary team and initiate plan and interventions as ordered. Re-assess patient's pain level 30 - 60 minutes after pain management intervention.   Outcome: Progressing     Problem: Safety  Goal: Patient will be injury free during hospitalization  Description: Assess and monitor vitals signs, neurological status including level of consciousness and orientation. Assess patient's risk for falls and implement fall prevention plan of care and interventions per hospital policy.      Ensure arm band on, uncluttered walking paths in room, adequate room lighting, call light and overbed table within reach, bed in low position, wheels locked, side rails up per policy, and non-skid footwear provided.   Outcome: Progressing  Goal: Patient with weight > 350lbs will have appropriate equipment  Description: Consider ordering Bariatric Bed, Chair and Bedside Commode for patient weight > 350 lbs.  Outcome: Progressing     Problem: Fall Prevention  Goal: Patient will remain free of falls  Description: Assess and monitor vitals signs, neurological status including level of consciousness and orientation.  Reassess fall risk per hospital policy.    Ensure arm band on, uncluttered walking paths in room, adequate room lighting, call light and overbed table within reach, bed in low position, wheels locked, side rails up per policy (excluding SNF), and non-skid footwear provided.   Outcome: Progressing     Problem: Daily Care  Goal: Daily care needs are met  Description: Assess and monitor ability to perform self care and identify potential discharge needs.  Outcome: Progressing     Problem: Psychosocial Needs  Goal: Demonstrates ability to cope with hospitalization/illness  Description: Assess and monitor patients ability to cope with his/her  illness.  Outcome: Progressing  Goal: Collaborate with patient/family to identify patient's goals  Outcome: Progressing     Problem: Discharge Barriers  Goal: Patient's discharge needs are met  Description: Collaborate with interdisciplinary team and initiate plans and interventions as needed.   Outcome: Progressing

## 2020-05-30 NOTE — Unmapped (Signed)
Family at bedside.

## 2020-05-30 NOTE — Unmapped (Signed)
Problem: Pain  Goal: Patient's pain is progressing toward patient's stated pain goal  Description: Assess and monitor patient's pain using appropriate pain scale. Collaborate with interdisciplinary team and initiate plan and interventions as ordered. Re-assess patient's pain level 30 - 60 minutes after pain management intervention.   Outcome: Progressing     Problem: Safety  Goal: Patient will be injury free during hospitalization  Description: Assess and monitor vitals signs, neurological status including level of consciousness and orientation. Assess patient's risk for falls and implement fall prevention plan of care and interventions per hospital policy.      Ensure arm band on, uncluttered walking paths in room, adequate room lighting, call light and overbed table within reach, bed in low position, wheels locked, side rails up per policy, and non-skid footwear provided.   Outcome: Progressing     Problem: Fall Prevention  Goal: Patient will remain free of falls  Description: Assess and monitor vitals signs, neurological status including level of consciousness and orientation.  Reassess fall risk per hospital policy.    Ensure arm band on, uncluttered walking paths in room, adequate room lighting, call light and overbed table within reach, bed in low position, wheels locked, side rails up per policy (excluding SNF), and non-skid footwear provided.   Outcome: Progressing     Problem: Daily Care  Goal: Daily care needs are met  Description: Assess and monitor ability to perform self care and identify potential discharge needs.  Outcome: Progressing     Problem: Psychosocial Needs  Goal: Demonstrates ability to cope with hospitalization/illness  Description: Assess and monitor patients ability to cope with his/her illness.  Outcome: Progressing  Goal: Collaborate with patient/family to identify patient's goals  Outcome: Progressing

## 2020-05-30 NOTE — Unmapped (Signed)
4th unit of O pos started at this time through level one

## 2020-05-30 NOTE — Unmapped (Signed)
Ob at bedside.

## 2020-05-30 NOTE — Unmapped (Signed)
Pt c/o 4/10 pain in abd.  Able to slowly ambulate to bathroom w/ minimal assistance. Urinated after foley removal. Yet to pass gas or have BM.

## 2020-05-30 NOTE — Unmapped (Addendum)
Patient started complaining of chest heaviness and difficulty breathing at 0513. Notified anesthesia. Obtained ECG per anesthesia order. Anesthesia to bedside. Administered pain medications. Patient resting quietly now. HR and BP are stable.WIl continue to monitor.     Peter Congo RN     Patient resting comfortably. Anesthesia signed patient out. Patient states pain level is tolerable and no nausea. Husband at bedside. Called report to step down charge nurse. Will transport patient over on transport monitor.     Peter Congo RN

## 2020-05-30 NOTE — Unmapped (Signed)
Anesthesia Extubation Criteria:    Airway Device: endotracheal tube    Emergence Details:      Smooth      _x_      Stormy       __       Prolonged   __     Extubation Criteria:      Motor strength intact       _x_      Follows commands        _x_      Good airway reflexes      _x_      OP suctioned                  _x_        Follows commands:  Yes     Patient extubated:  Yes

## 2020-05-30 NOTE — Unmapped (Signed)
History & Physical     Reason for ED Consult: likely ruptured ectopic    HPI: 34 y.o.  [id number] [facility] with hemoperitoneum on [place] found after presenting with severe pain, spotting x few weeks and syncopal events today. Was not aware of pregnancy.  Recent irregular menses and was recommended to see endocrine for eval/treatment.     2 term SVD, ages 34 and 7  One SAB  SA c husband, who is on his way.     No past medical history on file.  Past Surgical History:   Procedure Laterality Date   ??? APPENDECTOMY       Social History     Tobacco Use   ??? Smoking status: Never Smoker   ??? Smokeless tobacco: Never Used   Substance Use Topics   ??? Alcohol use: Yes   ??? Drug use: No     Meds- none  Allergies   Allergen Reactions   ??? Prochlorperazine      Rash   ??? Promethazine Hives     unsure   ??? Sulfa (Sulfonamide Antibiotics) Rash         Objective:  PE:  Vitals:    05/30/20 0130   BP: (!) 88/58   Pulse: 79   Resp: 25   Temp:    SpO2: 93%       Gen- NAD  CV- RRR  Pulm -CTAB  Abd- soft, very TTP  Ext- calves NTTP      Labs  B+  Lab Results   Component Value Date    WBC 10.3 05/30/2020    RBC 3.75 (L) 05/30/2020    HGB 11.1 (L) 05/30/2020    HCT 32.4 (L) 05/30/2020    MCV 86.3 05/30/2020    MCH 29.6 05/30/2020    MCHC 34.2 05/30/2020    RDW 14.1 05/30/2020    PLT 250 05/30/2020   HCG: 4367    Prelim [place] read- verbal- large amount of hemoperitoneum. Suspect ectopic pregnancy      Assessment/Plan  34 y.o. [phone] with hemoperitoneum, suspect rupture ectopic pregnancy  -discussed likely ruptured ectopic pregnancy and less likely possibility of ruptured ovarian cyst.   -recommend laparoscopic evaluation and treatment with likely unilateral salpingectomy vs salpingostomy, possible oophorectomy, possible laparotomy and other indicated procedures.   -consent obtained as outlined on form.   -getting 2nd unit of PRBC now  -covid swab done already

## 2020-05-30 NOTE — Unmapped (Signed)
Patient is independent from home and plan is to return with family to transport.      Jonnie Kind MSN, RN  Nurse Case Manager  [facility]  [phone]

## 2020-05-30 NOTE — Unmapped (Addendum)
OPERATIVE NOTE  ??  Pre-Operative Diagnosis: Suspected ruptured ectopic pregnancy     ??  Post-Operative Diagnosis: Ruptured right ectopic pregnancy, hemoperitoneum          Procedure: Laparoscopic right salpingectomy, evacuation of hemoperitoneum  ??  Surgeon:  Elliot Gault, MD  ??  Anesthetic:  GETA  ??  Findings:    AV uterus. 2.25 liters+ of hemoperitoneum. Right fallopian tube with dilated area consistent with tubal ectopic pregnancy.   ??  Pathology specimen:    Right fallopian tube  ??  Drains:    Foley with clear urine  ??  IVF:  6 units PRBC, 2 units FFP, 1 set of platelets.   ??  Urine Output:   350 cc  ??  Estimated Blood Loss:   Scant from surgery, 2.25 liters of hemoperitoneum  ??  Complications:  no complications were noted  ??  Disposition:      To RR in stable condition    Procedure: The patient was brought to the OR and underwent general anesthesia. She had received 4 units PRBC and the 5th was hanging. SCD were placed. Prep and drape was done. Time out was done.   ??  A Cohen uterine manipulator was placed. Gloves were changed.  ??  A 12 mm Hasson port was placed infraumbilically using the standard Hasson technique.??Blood was immediately noted upon peritoneal entry. The abdomen was insufflated??with CO2. Trendelenburg was done. 2 additional 5 mm ports were placed in each lower quadrant under direct visualization. The hemoperitoneum was suctioned to allow visualization of the fallopian tubes.??Survey was done.??A ruptured right ectopic pregnancy was noted. The left fallopian tube and bilateral ovaries were otherwise normal. Decision was made for salpingectomy due to??the extent of bleeding.  ??  The Ligasure was used to come across the mesosalpinx just under the fallopian tube. Edges were made hemostatic. The endobag was used to remove the specimen. Further suctioning was done to remove blood and clot. The abdomen was desufflated and the surgical bed remained hemostatic. All ports were removed under direct  visualization.  ??  The infraumbilical port site fascia was closed with 0 vicryl. Sub-Q was reapproximated with??3-0 vicryl. The skin was closed with 4-0 monocryl. Dressing was applied. The manipulator was removed and silver nitrate applied to oozing from the cervix.  ??  All counts were correct. There were no complications. Images were printed for the chart.??    She will remain in the hospital due to the extent of blood loss and transfusion required.

## 2020-05-30 NOTE — ED Notes (Signed)
Blood started 1 PRBC from trauma initiated, MD at bedside

## 2020-05-30 NOTE — Unmapped (Signed)
Upon morning assessment, patient expressing have pain on right side of neck radiating into head. Questioned if patient hit head when she fell in the ED, patient endorses hitting her head on the toilet. Upon examination, no hematoma noted. Oxycodone 5mg  PO administered to help with pain. Dr. Terri Skains made aware of pain, CT head ordered.

## 2020-05-30 NOTE — Unmapped (Signed)
BRIEF OPERATIVE NOTE    Pre-Operative Diagnosis: suspected ruptured ectopic pregnancy       Post-Operative Diagnosis: Ruptured right ectopic pregnancy, hemoperitoneum          Procedure: Laparoscopic right salpingectomy, evacuation of hemoperitoneum    Surgeon:  Elliot Gault, MD    Anesthetic:  GETA    Findings:    Av uterus. 2.25 liters of hemoperitoneum. Right fallopian tube with dilated area consistent with tubal ectopic pregnancy.     Pathology specimen:    Right fallopian tube    Drains:    Foley with clear urine    IVF:  6 units PRBC, 2 units FFP, 1 set of platelets.     Urine Output:   350 cc    Estimated Blood Loss:   Scant from surgery, 2.25 liters of hemoperitoneum    Complications:  no complications were noted    Disposition:      To RR in stable condition

## 2020-05-30 NOTE — Unmapped (Signed)
Pt admitted to room 235 from PACU. Pt c/o 4/10 abd pain, tolerable. 3 lap sites covered in mepilex. NS infusing @50ml /hr. Peripad with scant blood. Pt's husband updated and notified of visitor policy.

## 2020-05-30 NOTE — Progress Notes (Signed)
Patient feeling slightly better  Early this am complained of severe HA and neck pain  Now mostly in neck and shoulder  No weakness or numbness.   CT head done due to syncopal episodes and possible head trauma and was negative for acute findings.   Will continue post op advances  VS have been stable as well as UOP and CBC

## 2020-05-30 NOTE — ED Notes (Signed)
Labs sent, blood finished at this time, OB MD at bedside, patient alert and oriented x4

## 2020-05-30 NOTE — Unmapped (Signed)
Second O neg blood initiated

## 2020-05-30 NOTE — Unmapped (Addendum)
[place]  Case Management/Social Work Department  Discharge Planning Screen    Screening Questions     Do you need help filling out medical forms: No  Is patient from anywhere other than a private residence? (shelter, SNF, LTC, IPR, LTAC, etc.): No  Do you have any services that come into the house to help you? (COA, private duty, HHC, etc): No  Do you have barriers getting to follow up appointment or obtaining prescriptions?: No  Have you been to the (ED/hospital) 4x times in the past 6 months? : No    No discharge needs identified per patient interview    ?? CM met with the patient at the bedside and introduce CM role and discussed discharge planning  ?? Patient is independent from home and plan is to return with family to transport  ?? Patient denies use of DME or services at home  ?? Obstetrics consulted; laparoscopic right salpingectomy, evacuation of blood 8/30  ?? No PCP; [place] PCP list provided to patient at the bedside  ?? CM will continue to follow-up for developing discharge plan    Jonnie Kind MSN, RN  Nurse Case Manager  [facility]  [phone]

## 2020-05-31 MED ORDER — oxyCODONE (ROXICODONE) 5 MG immediate release tablet
5 | ORAL_TABLET | Freq: Four times a day (QID) | ORAL | 0 refills | 6.00000 days | Status: AC | PRN
Start: 2020-05-31 — End: 2020-06-03

## 2020-05-31 MED ORDER — ibuprofen (MOTRIN) 800 MG tablet
800 | ORAL_TABLET | Freq: Three times a day (TID) | ORAL | 0 refills | Status: AC | PRN
Start: 2020-05-31 — End: ?

## 2020-05-31 MED FILL — TYLENOL 325 MG TABLET: 325 325 mg | ORAL | Qty: 2

## 2020-05-31 MED FILL — POLYETHYLENE GLYCOL 3350 17 GRAM ORAL POWDER PACKET: 17 17 gram | ORAL | Qty: 1

## 2020-05-31 MED FILL — OXYCODONE 5 MG TABLET: 5 5 MG | ORAL | Qty: 1

## 2020-05-31 MED FILL — KETOROLAC 30 MG/ML (1 ML) INJECTION SOLUTION: 30 30 mg/mL (1 mL) | INTRAMUSCULAR | Qty: 1

## 2020-05-31 NOTE — Unmapped (Signed)
Discharge instructions given to patient. No further questions or needs at this time.

## 2020-05-31 NOTE — Unmapped (Signed)
[place]    Case Manager/Social Worker Discharge Summary     Patient name: Tanya Dunn                                        Patient MRN: [id number]  DOB: [birth year]                              Age: 34 y.o.              Gender: female  Patient emergency contact: Extended Emergency Contact Information  Primary Emergency Contact: Waites,Chad  Address: [address]           [place], [place] [address] [place]  Home Phone: [phone]  Mobile Phone: [phone]  Relation: Spouse  Preferred language: English      Attending provider: Miachel Roux, MD  Primary care physician: No Pcp    The MD has indicated that the patient is ready for discharge.  [name] was discharged to home.  The patient will be transported by family.    Transfer Mode/Level of Care: Family    DC Summary, AVS and COC have been updated/reviewed.     The plan has been reviewed:     Patient/Family Informed of Discharge Plan: Yes    Plan Reviewed With Patient, Family, or Significant Other: Yes    Patient and or family are aware and in agreement with the discharge plan: Yes             Plan reviewed with MD and other members of the health care team: Yes  Care Plan Completed: Yes    No further CM/SW needs.    This plan has been reviewed with the multi-disciplinary team.     Treatment Preferences         Post-Discharge Goals    Patient's Post-Discharge goals: Return to home    Post Acute Care Provider Information:           Advertising copywriter at Discharge  Community Services at Home post discharge: Not Applicable      Jonnie Kind MSN, RN  Nurse Case Manager  [facility]  [phone]

## 2020-05-31 NOTE — Unmapped (Signed)
CM spoke with the patient to provide CM updates and discuss final discharge plans.  Patient will be discharging to home with family to transport. No additional needs for CM at this time.     Jonnie Kind MSN, RN  Nurse Case Manager  [facility]  [phone]

## 2020-05-31 NOTE — Unmapped (Signed)
POD#1 s/p right salpingectomy for ectopic  Course complicated by intra-abdominal bleed requiring multiple blood transfusions, feeling well this evening with normal appatite, no N/V, abd pain controlled, no further shoulder pain    PE    Vitals:    05/31/20 1600   BP: 91/59   Pulse: 74   Resp: 20   Temp:    SpO2: 93%     Appears well  abd soft, appropriatley tender, incisions intact and appear well,   Ext no calf tenderness or edema    A/P  S/p laparoscopy for ruptutred ectopic - doing well, stable for discharge, restrictions given  Follow-up office 2 week, return to work 1 week

## 2020-05-31 NOTE — Plan of Care (Signed)
Problem: Pain  Goal: Patient's pain is progressing toward patient's stated pain goal  Description: Assess and monitor patient's pain using appropriate pain scale. Collaborate with interdisciplinary team and initiate plan and interventions as ordered. Re-assess patient's pain level 30 - 60 minutes after pain management intervention.   Outcome: Progressing     Problem: Safety  Goal: Patient will be injury free during hospitalization  Description: Assess and monitor vitals signs, neurological status including level of consciousness and orientation. Assess patient's risk for falls and implement fall prevention plan of care and interventions per hospital policy.      Ensure arm band on, uncluttered walking paths in room, adequate room lighting, call light and overbed table within reach, bed in low position, wheels locked, side rails up per policy, and non-skid footwear provided.   Outcome: Progressing  Goal: Patient with weight > 350lbs will have appropriate equipment  Description: Consider ordering Bariatric Bed, Chair and Bedside Commode for patient weight > 350 lbs.  Outcome: Progressing     Problem: Fall Prevention  Goal: Patient will remain free of falls  Description: Assess and monitor vitals signs, neurological status including level of consciousness and orientation.  Reassess fall risk per hospital policy.    Ensure arm band on, uncluttered walking paths in room, adequate room lighting, call light and overbed table within reach, bed in low position, wheels locked, side rails up per policy (excluding SNF), and non-skid footwear provided.   Outcome: Progressing     Problem: Daily Care  Goal: Daily care needs are met  Description: Assess and monitor ability to perform self care and identify potential discharge needs.  Outcome: Progressing     Problem: Psychosocial Needs  Goal: Demonstrates ability to cope with hospitalization/illness  Description: Assess and monitor patients ability to cope with his/her  illness.  Outcome: Progressing  Goal: Collaborate with patient/family to identify patient's goals  Outcome: Progressing     Problem: Discharge Barriers  Goal: Patient's discharge needs are met  Description: Collaborate with interdisciplinary team and initiate plans and interventions as needed.   Outcome: Progressing     Problem: Discharge Planning  Goal: Identify discharge needs  Outcome: Progressing

## 2020-05-31 NOTE — Unmapped (Signed)
Please call [phone] to schedule a post-op appointment with Dr. Porfirio Oar in 2 weeks  Take next week off work    Call for any fever or pain getting worse or better  Keep incisions clean and dry, no tub baths or pools for 1 week    Ruptured Ectopic Pregnancy    An ectopic pregnancy is when a fertilized egg attaches (implants) outside of the uterus, usually in a fallopian tube. A ruptured ectopic pregnancy is when the fallopian tube tears or bursts. This results in internal bleeding, intense abdominal pain, and sometimes, vaginal bleeding.  Most ectopic pregnancies occur in the fallopian tube. In rare cases, it may occur on the ovary, intestine, pelvis, or cervix. An ectopic pregnancy does not have the ability to develop into a normal, healthy baby. A ruptured ectopic pregnancy can affect your ability to have children (fertility), depending on damage it causes to your reproductive organs.  Ruptured ectopic pregnancy is a medical emergency. If not treated immediately, it can lead to blood loss, shock, or even death.  What are the causes?  Most ectopic pregnancies are caused by damage to the fallopian tubes. The damage prevents the fertilized egg from implanting in the uterus. In some cases, the cause may not be known.  What increases the risk?  You are at increased risk for an ectopic pregnancy if:  ?? You have had a previous ectopic pregnancy.  ?? You have had previous fallopian tube surgery.  ?? You have had previous surgery to have the fallopian tubes tied (tubal ligation).  ?? You have had infertility treatments or have a history of infertility.  ?? You have been exposed to DES. DES is a medicine that was used until 1971 and had effects on babies whose mothers took the medicine.  ?? You use an IUD (intrauterine device) for birth control.  ?? You use progestin-only oral contraception for birth control.  ?? You have a history of pelvic inflammatory disease (PID).  ?? You have a history of endometriosis.  ?? You smoke.  ?? You  became sexually active before 34 years of age.  ?? You have multiple sexual partners.  What are the signs or symptoms?  Symptoms of a ruptured ectopic pregnancy and internal bleeding may include:  ?? Sudden, severe pain in the abdomen and pelvis.  ?? Dizziness or fainting.  ?? Pain in the shoulder area.  ?? Vaginal bleeding.  How is this diagnosed?  This condition is diagnosed based on your medical history, symptoms, a physical exam, and tests, which may include:  ?? A pregnancy test.  ?? An ultrasound.  ?? Measuring the levels of the pregnancy hormone in the bloodstream.  ?? Taking a sample of tissue from the uterus (dilation and curettage, D&C).  ?? Surgery to visually examine the inside of the abdomen using a lighted tube (laparoscopy).  How is this treated?  This condition is treated with IV fluids and emergency surgery to remove the ectopic pregnancy and repair the area where the rupture occured.  If you have lost a lot of blood, you may need a blood transfusion.  If you are Rh negative and your baby's father is Rh positive, or the Rh type of the father is unknown, you may receive a Rho (D) immune globulin shot. This is to prevent Rh problems in future pregnancies.  Additional medicines may be given.  Get help right away if:  ?? You are taking medicines to treat an ectopic pregnancy and you develop symptoms of  a rupture. These include:  ? Fever or chills.  ? Shoulder pain.  ? Vaginal bleeding.  ? Nausea and vomiting.  ? Severe abdominal pain or cramping.  ? Feeling light-headed or fainting.  Summary  ?? An ectopic pregnancy is when a fertilized egg attaches (implants) outside of the uterus, usually in a fallopian tube. A ruptured ectopic pregnancy is when the fallopian tube tears or bursts.  ?? Ruptured ectopic pregnancy is a medical emergency. If not treated immediately, it can lead to blood loss, shock, or even death.  ?? This condition is treated with IV fluids and emergency surgery to remove the ectopic pregnancy and  repair the area where the rupture occured. If you have lost a lot of blood, you may need a blood transfusion.  This information is not intended to replace advice given to you by your health care provider. Make sure you discuss any questions you have with your health care provider.  Document Revised: 08/30/2017 Document Reviewed: 12/05/2016  Elsevier Patient Education ?? 2021 [employer]    Salpingectomy, Care After  This sheet gives you information about how to care for yourself after your procedure. Your health care provider may also give you more specific instructions. If you have problems or questions, contact your health care provider.  What can I expect after the procedure?  After the procedure, it is common to have:  ?? Pain in your abdomen.  ?? Some light vaginal bleeding (spotting) for a few days.  ?? Tiredness.  Your recovery time will vary depending on which method your surgeon used for your surgery.  Follow these instructions at home:  Incision care    ?? Follow instructions from your health care provider about how to take care of your incisions. Make sure you:  ? Wash your hands with soap and water before and after you change your bandage (dressing). If soap and water are not available, use hand sanitizer.  ? Change or remove your dressing as told by your health care provider.  ? Leave any stitches (sutures), skin glue, or adhesive strips in place. These skin closures may need to stay in place for 2 weeks or longer. If adhesive strip edges start to loosen and curl up, you may trim the loose edges. Do not remove adhesive strips completely unless your health care provider tells you to do that.  ?? Keep your dressing clean and dry.  ?? Check your incision area every day for signs of infection. Check for:  ? Redness, swelling, or pain that gets worse.  ? Fluid or blood.  ? Warmth.  ? Pus or a bad smell.  Activity  ?? Rest as told by your health care provider.  ?? Avoid sitting for a long time without moving. Get up  to take short walks every 1-2 hours. This is important to improve blood flow and breathing. Ask for help if you feel weak or unsteady.  ?? Return to your normal activities as told by your health care provider. Ask your health care provider what activities are safe for you.  ?? Do not drive until your health care provider says that it is safe.  ?? Do not lift anything that is heavier than 10 lb (4.5 kg), or the limit that you are told, until your health care provider says that it is safe. This may be 2-6 weeks depending on your surgery.  ?? Until your health care provider approves:  ? Do not douche.  ? Do not use tampons.  ?  Do not have sex.  Medicines  ?? Take over-the-counter and prescription medicines only as told by your health care provider.  ?? Ask your health care provider if the medicine prescribed to you:  ? Requires you to avoid driving or using heavy machinery.  ? Can cause constipation. You may need to take actions to prevent or treat constipation, such as:  ?? Drink enough fluid to keep your urine pale yellow.  ?? Take over-the-counter or prescription medicines.  ?? Eat foods that are high in fiber, such as beans, whole grains, and fresh fruits and vegetables.  ?? Limit foods that are high in fat and processed sugars, such as fried or sweet foods.  General instructions  ?? Wear compression stockings as told by your health care provider. These stockings help to prevent blood clots and reduce swelling in your legs.  ?? Do not use any products that contain nicotine or tobacco, such as cigarettes, e-cigarettes, and chewing tobacco. If you need help quitting, ask your health care provider.  ?? Do not take baths, swim, or use a hot tub until your health care provider approves. You may take showers.  ?? Keep all follow-up visits as told by your health care provider. This is important.  Contact a health care provider if you have:  ?? Pain when you urinate.  ?? Redness, swelling, or pain around an incision.  ?? Fluid or blood  coming from an incision.  ?? Pus or a bad smell coming from an incision.  ?? An incision that feels warm to the touch.  ?? A fever.  ?? Abdominal pain that gets worse or does not get better with medicine.  ?? An incision that starts to break open.  ?? A rash.  ?? Light-headedness.  ?? Nausea and vomiting.  Get help right away if you:  ?? Have pain in your chest or leg.  ?? Develop shortness of breath.  ?? Faint.  ?? Have increased or heavy vaginal bleeding, such as soaking a pad in an hour.  Summary  ?? After the procedure, it is common to feel tired, have some pain in your abdomen, and have some light vaginal bleeding for a few days.  ?? Follow instructions from your health care provider about how to take care of your incisions.  ?? Return to your normal activities as told by your health care provider. Ask your health care provider what activities are safe for you.  ?? Do not douche, use tampons, or have sex until your health care provider approves.  ?? Keep all follow-up visits as told by your health care provider.  This information is not intended to replace advice given to you by your health care provider. Make sure you discuss any questions you have with your health care provider.  Document Revised: 09/08/2018 Document Reviewed: 09/08/2018  Elsevier Patient Education ?? 2021 [employer].

## 2020-06-01 NOTE — Telephone Encounter (Signed)
I was connected to patient and spoke to her at the time.  The bleeding started yesterday and is like a heavy menstrual period with some clots.  Her cramping is mild.  Ibuprofen is not doing much for the pain at the incision sites but the oxycodone is.  I advised her that this is normal after such surgery as she is passing the tissue that had built up inside the uterus.  Recommended she take 2 Aleve TID for the next 2 days which may slow the bleeding and help with the pain.  She denies being lightheaded or dizzy.  She is staying hydrated.  She is to call back if the bleeding does not start to slow down by later today.  She stated she understood.     JWB

## 2020-06-01 NOTE — Unmapped (Addendum)
MRN: [id number]     Patient Name : Tanya Dunn     Patient Date of Birth: 03-Nov-1985     Relationship of Caller to Patient: [place] [phone]    Patient of: Dr. Sharion Balloon of Call:  [name] had a salpingo-oophoreomy for an ectopic on 08/30 and she said this morning she is bleeding with clots.  She requests to speak to the physician on call.     On Call Provider Contacted: Dr. Ashok Cordia     Provider contacted via pager or cell  Cell    Time and Method: Dr. Ashok Cordia was called at 07:37 and made aware of the above and directly connected to the patient.     Caller advised to call back in 30 minutes, if they do not hear from the provider on-call.  Note was routed to the receiving on-call provider/pool.

## 2020-06-09 NOTE — Unmapped (Signed)
Late entry:    Date of admission: 05/30/20  Date of discharge: 05/31/20    Admitted for surgery for ruptured right ectopic pregnancy with hemoperitoneum.  Right salpingectomy performed laparoscopically.  Received the following blood products: 6 units PRBC, 2 units FFP, 1 set of platelets.??  ??  Post op course was normal.  On day of discharge tolerating regular diet, vitals stable, pain controlled.  Will follow up in 1-2 weeks with Dr. Porfirio Oar    Questions ansered.

## 2020-06-20 ENCOUNTER — Ambulatory Visit: Payer: PRIVATE HEALTH INSURANCE

## 2020-09-28 NOTE — Unmapped (Signed)
Explained mwl and wait

## 2020-11-08 NOTE — Unmapped (Signed)
Offered teams

## 2020-11-14 NOTE — Unmapped (Signed)
Too busy

## 2020-11-21 NOTE — Unmapped (Signed)
Checking status

## 2020-11-28 NOTE — Unmapped (Signed)
We have attempted to reach out to this patient multiple times through either voice mail and/or email without any response.  We just sent a letter as our final attempt to contact.

## 2022-12-05 ENCOUNTER — Ambulatory Visit: Admit: 2022-12-05 | Discharge: 2022-12-05 | Payer: PRIVATE HEALTH INSURANCE

## 2022-12-05 DIAGNOSIS — N6122 Granulomatous mastitis, left breast: Secondary | ICD-10-CM

## 2022-12-05 NOTE — Patient Instructions (Signed)
[email or web address]

## 2022-12-05 NOTE — Progress Notes (Signed)
Review of Systems   All other systems reviewed and are negative.

## 2022-12-05 NOTE — Progress Notes (Signed)
Subjective   History of Present Illness:   Tanya Dunn is a 37 y.o. 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 She is currently 2 months pregnant. She underwent a right breast excisional biopsy of benign papilloma on 04/12/22.    HPI     She reports starting to have pain and swelling with symptoms of infection in her left breast in October 2023. She has been following with Dr. Marianna Fuss at [facility] and was admitted on 11/14/22 for IV antibiotics and a left breast abscess. She discovered she was pregnant at that time. She underwent I&D and is packing the wound. She was started on linezolid on 11/26/22 for possible treatment, but developed worsening nausea and stopped the medication. She also underwent kenalog injections in her left breast for treatment of IGM. She has resumed the linezolid as well for concurrent infection with corynebacterium and plans to continue for 2 weeks.    She feels her symptoms have improved with the kenalog injections and the antibiotics. She continues to have 2 open wounds on her left breast and is packing them daily. She denies fever.     Breast History:   Race: Caucasian  Ashkenazi Jewish, [place], or Amish ancestry: no  Age of Menarche: 86  LMP: No LMP recorded.  Drema Dallas Aborta: G5P2, currently pregnant   Age at first childbirth: 3  Assisted Reproduction Treatments: Yes bromocriptine and cabergoline   History of breast feeding: Yes, less than 1 month  History of hormonal contraceptives:    Age of Menopause: NA    History of HRT: No (If yes, how many years taken and what ages taken?)  Prior history of breast biopsy if known: Yes    Family history of breast or ovarian cancer: yes   Grandparents: no   Parents/Aunts/Uncles: yes Maunt [place]  Siblings: no   Children: no     Family history of other cancers: yes   Grandparents: no   Parents/Aunts/Uncles: yes Paunt melanoma   Siblings: no    Children:no   3 generation family history obtained: yes   if no, reason: NA    Patient had genetic testing in the past:No  Family history of genetic testing and results: No  Patient interested in future childbearing: yes  Patient interested in meeting with a fertility specialist: no    Smoking Status: History smoking: none  Alcohol Status: none    Histories:     She has no past medical history on file.    She has a past surgical history that includes Appendectomy; Diagnostic laparoscopy (N/A, 05/30/2020); and Abscess drainage.    Her family history includes Heart disease in her maternal grandfather and paternal grandfather; Melanoma in her paternal aunt; Ovarian cancer in her maternal aunt; Stroke in her maternal grandfather and paternal grandfather.    She reports that she has never smoked. She has never used smokeless tobacco. She reports that she does not currently use alcohol. She reports that she does not use drugs.      Review of Systems    Allergies:   Prochlorperazine, Promethazine, and Sulfa (sulfonamide antibiotics)    Medications:     Outpatient Encounter Medications as of 12/05/2022   Medication Sig Dispense Refill    bromocriptine (PARLODEL) 2.5 mg tablet       cabergoline (DOSTINEX) 0.5 mg tablet       DOCOSAHEXAENOIC ACID ORAL Take 3 tablets  by mouth daily.      linezolid (ZYVOX) 100 mg/5 mL suspension Take 10 mg/kg by mouth 3 times a day.      ondansetron (ZOFRAN) 4 mg/5 mL solution Take 5 mLs (4 mg total) by mouth every 8 hours as needed for Nausea.      ondansetron (ZOFRAN-ODT) 4 MG disintegrating tablet Take by mouth.      VITAFOL GUMMIES 3.33 mg iron- 0.33 mg Chew       [DISCONTINUED] ibuprofen (MOTRIN) 800 MG tablet Take 1 tablet (800 mg total) by mouth every 8 hours as needed for Pain. 60 tablet 0    [DISCONTINUED] prenatal vit-iron fumarate-FA 27-1 mg Tab Take by mouth.       No facility-administered encounter medications on file as of 12/05/2022.       Objective   Blood pressure 110/75, pulse  98, temperature 97.3 F (36.3 C), temperature source Temporal, resp. rate 18, height 5' 3 (1.6 m), weight 188 lb 6.4 oz (85.5 kg), SpO2 97%.    Physical Exam  Vitals and nursing note reviewed.   Constitutional:       Appearance: She is well-developed.   HENT:      Head: Normocephalic and atraumatic.   Eyes:      Conjunctiva/sclera: Conjunctivae normal.      Pupils: Pupils are equal, round, and reactive to light.   Pulmonary:      Effort: Pulmonary effort is normal. No respiratory distress.   Chest:   Breasts:     Breasts are symmetrical.      Right: No inverted nipple, mass, nipple discharge, skin change or tenderness.      Left: Skin change and tenderness present. No inverted nipple, mass or nipple discharge.       Abdominal:      General: There is no distension.      Palpations: Abdomen is soft.   Musculoskeletal:         General: Normal range of motion.      Cervical back: Normal range of motion and neck supple.   Lymphadenopathy:      Cervical: No cervical adenopathy.      Upper Body:      Right upper body: No supraclavicular or axillary adenopathy.      Left upper body: No supraclavicular or axillary adenopathy.   Skin:     General: Skin is warm and dry.   Neurological:      Mental Status: She is alert and oriented to person, place, and time.   Psychiatric:         Behavior: Behavior normal.         Thought Content: Thought content normal.         Judgment: Judgment normal.         Review of Lab Results:      Lab Results   Component Value Date    WBC 9.4 05/30/2020    HGB 12.9 05/30/2020    HCT 36.6 05/30/2020    PLT 154 05/30/2020    GLUCOSE 203 (H) 05/30/2020    BUN 11 05/30/2020    CREATININE 0.49 (L) 05/30/2020    NA 139 05/30/2020    K 4.1 05/30/2020    CL 107 05/30/2020    CO2 25 05/30/2020       Imaging:   MAMM-ULTRASOUND BREAST LIMITED(FOCUS AREA)    Anatomical Region Laterality Modality   -- -- Mammography     Impression    IMPRESSION:    Parenchymal  inflammation in the upper left breast  corresponding to the area of concern without discrete fluid collection. Appropriate clinical follow-up is recommended.    BI-RADS CATEGORY: 2 - BENIGN    RECOMMENDED FOLLOW-UP: Left Appropriate action should be taken        If additional imaging and/or biopsy is indicated, [facility] staff will inform the patient of the findings and recommendations, obtain necessary orders from the referring provider, and schedule the patient for appointment as appropriate.    Any palpable abnormality must be assessed clinically. If the patient has a new or unexplained palpable abnormality, then a diagnostic mammogram and/or breast ultrasound may be indicated.    The [employer] recommends annual screening mammography for average risk women beginning at age 48.    This report was created using voice recognition software. While attempts have been made to review the report as it was transcribed, occasionally the spoken word can be misinterpreted by the technology leading to omissions or inappropriate words, phrases, or numerical values. If there is clinical discordance/ambiguity or other questions or concerns arise, please contact the Radiology Department at [phone].    Signed By: Hassell Done, MD, Janett Billow  Narrative    MAMM-ULTRASOUND BREAST LIMITED(FOCUS AREA)    REASON FOR EXAM: Breast abscess    COMPARISON: Multiple prior exams, notably, 11/04/2022    TECHNIQUE: Real-time sonographic evaluation was performed by a sonographer and the submitted images were reviewed by a radiologist.    FINDINGS:    On targeted ultrasound of the left breast in the region of the palpable finding in the upper outer quadrant and upper inner quadrants, there is parenchymal edema without discrete fluid collection. There is a small amount of fluid in the upper outer quadrant but this does not appear drainable.    Procedure Note    Santiago Glad., MD - 11/21/2022  Formatting of this note might be different from the  original.  MAMM-ULTRASOUND BREAST LIMITED(FOCUS AREA)    REASON FOR EXAM: Breast abscess    COMPARISON: Multiple prior exams, notably, 11/04/2022    TECHNIQUE: Real-time sonographic evaluation was performed by a sonographer and the submitted images were reviewed by a radiologist.    FINDINGS:    On targeted ultrasound of the left breast in the region of the palpable finding in the upper outer quadrant and upper inner quadrants, there is parenchymal edema without discrete fluid collection. There is a small amount of fluid in the upper outer quadrant but this does not appear drainable.        IMPRESSION:    Parenchymal inflammation in the upper left breast corresponding to the area of concern without discrete fluid collection. Appropriate clinical follow-up is recommended.    BI-RADS CATEGORY: 2 - BENIGN    RECOMMENDED FOLLOW-UP: Left Appropriate action should be taken        If additional imaging and/or biopsy is indicated, [facility] staff will inform the patient of the findings and recommendations, obtain necessary orders from the referring provider, and schedule the patient for appointment as appropriate.    Any palpable abnormality must be assessed clinically. If the patient has a new or unexplained palpable abnormality, then a diagnostic mammogram and/or breast ultrasound may be indicated.    The [employer] recommends annual screening mammography for average risk women beginning at age 58.    This report was created using voice recognition software. While attempts have been made to review the report as it was transcribed, occasionally  the spoken word can be misinterpreted by the technology leading to omissions or inappropriate words, phrases, or numerical values. If there is clinical discordance/ambiguity or other questions or concerns arise, please contact the Radiology Department at [phone].    Signed By: Hassell Done, MD, Jessica  Exam End: 11/21/22  9:53 AM    Specimen  Collected: 11/21/22 10:02 AM Last Resulted: 11/21/22 10:04 AM   Received From: [facility]  Result Received: 12/03/22  1:58 PM     Pathology:       Cancer Staging:    Cancer Staging   No matching staging information was found for the patient.       Assessment   37 y.o. female presents for a 2nd opinion of left breast granulomatous mastitis. She has 2 open wounds on her left breast that she is packing daily. There is yellow drainage. Her presentation is consistent with granulomatous mastitis.      Plan   Reviewed imaging and treatment with patient.     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  IGM is a rare inflammatory disease that involves the breast. It is thought to begin with ductal epithelial damage, transition of luminal secretions to the lobular connective tissue, local inflammation in connective tissue, macrophage and lymphocyte migration to the region, and local granulomatosis inflammatory response. Etiologies are believe to include Autoimmunity, pregnancy, lactation, hyperprolactinemia, oral contraceptive use, local trauma to the breast, alpha-1 antitrypsin deficiency, rarely observed infectious factors, local irritants, smoking, and diabetes mellitus . It is common in parous, young women. There is no increase risk of breast cancer. The importance is that this is a diagnosis of exclusion. It is a self limiting disease that ranges from 5-20 months. Greater time to resolution includes age,  antibiotics, incision and drainage and parity. It is important to be aware of the diagnosis to prevent unnecessary medications and procedures.    Recommended she continue the linezolid as prescribed and kenalog injections as this is the recommended treatment for IGM.     She reports being comfortable with her care at [facility] and will continue follow up there. Instructed her to return should she have new concerns or wish  to transfer care.     I have reviewed and agree with her review of systems, past medical/surgical history/family history, social history, allergies, and medications as documented.

## 2022-12-06 DIAGNOSIS — N6122 Granulomatous mastitis, left breast: Secondary | ICD-10-CM

## 2022-12-12 ENCOUNTER — Inpatient Hospital Stay: Admit: 2022-12-12 | Payer: PRIVATE HEALTH INSURANCE

## 2022-12-12 ENCOUNTER — Inpatient Hospital Stay: Admit: 2022-12-12
